# Patient Record
Sex: Male | Born: 1985 | Race: Black or African American | Hispanic: No | Marital: Single | Smoking: Never smoker
Health system: Southern US, Community
[De-identification: ages and names within clinical notes are randomized; demographics above are authoritative.]

## PROBLEM LIST (undated history)

## (undated) DIAGNOSIS — B019 Varicella without complication: Secondary | ICD-10-CM

## (undated) DIAGNOSIS — Z21 Asymptomatic human immunodeficiency virus [HIV] infection status: Secondary | ICD-10-CM

## (undated) DIAGNOSIS — B20 Human immunodeficiency virus [HIV] disease: Secondary | ICD-10-CM

## (undated) HISTORY — DX: Asymptomatic human immunodeficiency virus (hiv) infection status: Z21

## (undated) HISTORY — DX: Human immunodeficiency virus (HIV) disease: B20

## (undated) HISTORY — DX: Varicella without complication: B01.9

---
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
## 2004-05-19 ENCOUNTER — Emergency Department (HOSPITAL_COMMUNITY): Admission: EM | Admit: 2004-05-19 | Discharge: 2004-05-19 | Payer: Self-pay | Admitting: Emergency Medicine

## 2004-07-10 ENCOUNTER — Emergency Department (HOSPITAL_COMMUNITY): Admission: EM | Admit: 2004-07-10 | Discharge: 2004-07-10 | Payer: Self-pay | Admitting: Emergency Medicine

## 2005-06-22 ENCOUNTER — Emergency Department (HOSPITAL_COMMUNITY): Admission: EM | Admit: 2005-06-22 | Discharge: 2005-06-22 | Payer: Self-pay | Admitting: Emergency Medicine

## 2007-10-24 ENCOUNTER — Emergency Department (HOSPITAL_COMMUNITY): Admission: EM | Admit: 2007-10-24 | Discharge: 2007-10-24 | Payer: Self-pay | Admitting: Emergency Medicine

## 2009-05-19 ENCOUNTER — Ambulatory Visit: Payer: Self-pay | Admitting: Family Medicine

## 2009-05-19 DIAGNOSIS — B351 Tinea unguium: Secondary | ICD-10-CM

## 2009-05-20 LAB — CONVERTED CEMR LAB
Albumin: 4.3 g/dL (ref 3.5–5.2)
Alkaline Phosphatase: 64 units/L (ref 39–117)
Basophils Absolute: 0 10*3/uL (ref 0.0–0.1)
Bilirubin, Direct: 0.1 mg/dL (ref 0.0–0.3)
CO2: 31 meq/L (ref 19–32)
Calcium: 9.6 mg/dL (ref 8.4–10.5)
Chloride: 106 meq/L (ref 96–112)
Eosinophils Absolute: 0.1 10*3/uL (ref 0.0–0.7)
Glucose, Bld: 92 mg/dL (ref 70–99)
Hemoglobin: 14.7 g/dL (ref 13.0–17.0)
Lymphocytes Relative: 39.7 % (ref 12.0–46.0)
MCHC: 33.2 g/dL (ref 30.0–36.0)
Neutro Abs: 2.4 10*3/uL (ref 1.4–7.7)
Neutrophils Relative %: 48.4 % (ref 43.0–77.0)
Potassium: 3.8 meq/L (ref 3.5–5.1)
RBC: 4.47 M/uL (ref 4.22–5.81)
RDW: 11.8 % (ref 11.5–14.6)
Sodium: 143 meq/L (ref 135–145)
Total CHOL/HDL Ratio: 3

## 2009-06-01 ENCOUNTER — Ambulatory Visit: Payer: Self-pay | Admitting: Family Medicine

## 2009-06-01 LAB — CONVERTED CEMR LAB: Free T4: 0.8 ng/dL (ref 0.6–1.6)

## 2009-06-03 LAB — CONVERTED CEMR LAB: TSH: 0.53 microintl units/mL (ref 0.35–5.50)

## 2009-06-15 ENCOUNTER — Emergency Department (HOSPITAL_BASED_OUTPATIENT_CLINIC_OR_DEPARTMENT_OTHER): Admission: EM | Admit: 2009-06-15 | Discharge: 2009-06-15 | Payer: Self-pay | Admitting: Emergency Medicine

## 2009-06-17 ENCOUNTER — Ambulatory Visit: Payer: Self-pay | Admitting: Family Medicine

## 2009-06-18 ENCOUNTER — Emergency Department (HOSPITAL_COMMUNITY): Admission: EM | Admit: 2009-06-18 | Discharge: 2009-06-18 | Payer: Self-pay | Admitting: Family Medicine

## 2009-06-19 ENCOUNTER — Encounter: Payer: Self-pay | Admitting: Family Medicine

## 2009-06-19 ENCOUNTER — Telehealth: Payer: Self-pay | Admitting: Family Medicine

## 2009-07-15 ENCOUNTER — Ambulatory Visit: Payer: Self-pay | Admitting: Family Medicine

## 2009-07-16 ENCOUNTER — Encounter: Payer: Self-pay | Admitting: Family Medicine

## 2010-02-15 ENCOUNTER — Telehealth: Payer: Self-pay | Admitting: Family Medicine

## 2010-02-25 ENCOUNTER — Inpatient Hospital Stay (HOSPITAL_COMMUNITY): Admission: EM | Admit: 2010-02-25 | Discharge: 2009-06-21 | Payer: Self-pay | Admitting: Emergency Medicine

## 2010-04-20 NOTE — Progress Notes (Signed)
Summary: refill at new pharmacy  Phone Note Refill Request Call back at [phone] Message from:  Patient---live call  Refills Requested: Medication #1:  TERBINAFINE HCL 250 MG TABS one by mouth once daily for 3 months.. call  [facility] in [place] @ phone number-[phone].  any? call pt  Initial call taken by: Warnell Forester,  February 15, 2010 2:19 PM  Follow-up for Phone Call        pt must have follow up before any further refills. We would have to assess liver before prescribing requested med. Follow-up by: Evelena Peat MD,  February 15, 2010 2:46 PM  Additional Follow-up for Phone Call Additional follow up Details #1::        pt will call back to make an appt. Additional Follow-up by: Warnell Forester,  February 15, 2010 3:20 PM

## 2010-04-20 NOTE — Letter (Signed)
Summary: Generic Letter  [place] [facility]  [address] [place], [place] [address]   Phone: [phone]  Fax: [phone]    06/19/2009   Regarding:  Jose Olsen [address] [place], [place]  [address]  Attention:  [name] is a patient of mine at [facility] in [place], [place].  He was recently admitted to our local hospital [facility]) and will be unable to keep his court date for Monday June 22 2009 for that reason.  His date of discharge has not been determined at this time.     Sincerely,   Evelena Peat MD

## 2010-04-20 NOTE — Letter (Signed)
Summary: FMLA  FMLA   Imported By: [employer]ian [employer] 07/16/2009 11:05:14  _____________________________________________________________________  External Attachment:    Type:   Image     Comment:   External Document

## 2010-04-20 NOTE — Progress Notes (Signed)
Summary: Patient admitted to [facility]  Phone Note Call from Patient Call back at [phone]   Caller: Patient Reason for Call: Talk to Nurse Summary of Call: Patient was admitted to the hospital last night with suspected Colitis.  Patient wanted Dr. Caryl Never to know he was admitted and also wanted to know when a physician was going to come and see him. Initial call taken by: Everrett Coombe,  June 19, 2009 3:17 PM  Follow-up for Phone Call        Pt called to adv that he needs to have a letter faxed to the courthouse in [place] stating the patient is in the hospital and can't be in court on Monday, 06/22/2009... Pt was admitted for possible ulcerative colitis... Pt adv that he was seen Wednesday by Dr Caryl Never for sxs.... Pt adv that [facility] staff adv that the letter would need to be established by his PCP or the Doctor on call for the hospital, and pt adv that he hasn't seen a doctor since 11:30pm last night.... Letter can be faxed to [employer] at  [phone] - Attention: Marguerite Olea.  Pt can be reached at [phone] [facility] hospital) with any questions or concerns.  Follow-up by: Debbra Riding,  June 19, 2009 3:46 PM  Additional Follow-up for Phone Call Additional follow up Details #1::        spoke with patient.  He is waiting for further testing, presumably colonoscopy to further evaluate.  Will send letter to [place] on pt's behalf to notify of conflict with court date in [place] for Monday. Additional Follow-up by: Evelena Peat MD,  June 19, 2009 4:51 PM    Additional Follow-up for Phone Call Additional follow up Details #2::    Letter faxed as requested, confirmation received Follow-up by: Sid Falcon LPN,  June 19, 2009 5:06 PM

## 2010-04-20 NOTE — Assessment & Plan Note (Signed)
Summary: GI virus/dm   Vital Signs:  Patient profile:   25 year old male Weight:      178 pounds Temp:     98.7 degrees F oral BP sitting:   120 / 80  (left arm) Cuff size:   regular  Vitals Entered By: Sid Falcon LPN (June 17, 2009 1:39 PM) CC: ongoing diarrhea, GI virus   History of Present Illness: Patient seen from ER followup. Onset Sunday night of chills and subsequent nausea and diarrhea. No vomiting. Nausea has improved but continues to have about 8-10 watery mostly nonbloody stools per day. Chills have resolved. Poor appetite.  Labs reviewed from the ER. Urinalysis unremarkable. Electrolytes normal. White blood count 16.8 thousand. Has some crampy diffuse abdominal pain. Denies dysuria. Only tried one dose of Imodium in emergency room and this did not help much. None since then. No recent antibiotics or travels.  IVFs in ER.  Allergies (verified): No Known Drug Allergies  Past History:  Past Medical History: Last updated: 05/19/2009 Chicken pox  Review of Systems       The patient complains of anorexia.  The patient denies fever, melena, severe indigestion/heartburn, and incontinence.    Physical Exam  General:  Well-developed,well-nourished,in no acute distress; alert,appropriate and cooperative throughout examination Mouth:  Oral mucosa and oropharynx without lesions or exudates.  Teeth in good repair. Neck:  No deformities, masses, or tenderness noted. Lungs:  Normal respiratory effort, chest expands symmetrically. Lungs are clear to auscultation, no crackles or wheezes. Heart:  Normal rate and regular rhythm. S1 and S2 normal without gallop, murmur, click, rub or other extra sounds. Abdomen:  soft, non-tender, normal bowel sounds, no distention, no masses, no guarding, and no rigidity. minimal tenderness right and left lower quadrants to deep palpation. No guarding or rebound. soft, non-tender, normal bowel sounds, no distention, no masses, no guarding, no  rigidity, no hepatomegaly, and no splenomegaly.     Impression & Recommendations:  Problem # 1:  GASTROENTERITIS, VIRAL (ICD-008.8) no evidence for significant dehydration at this time.  Complete Medication List: 1)  Terbinafine Hcl 250 Mg Tabs (Terbinafine hcl) .... One by mouth once daily for 3 months.  Patient Instructions: 1)  Try over-the-counter Imodium 2)  Drink plenty of fluids 3)  Follow up immediately if you develop any increased fever or worsening symptoms 4)  Eat more potassium rich foods such as bananas, oranje juice, and salt substitutes .  5)  try electrolyte replacement such as Gatorade 6)  The main problem with gastroentereritis is dehydration. Drink plenty of fluids and take solids as you feel better. If you are unable to keep anything down and/or you show signs of dehydration( dry cracked lips, lack of tears, not urinating, very sleepy) , call our office.

## 2010-04-20 NOTE — Assessment & Plan Note (Signed)
Summary: new to est-requesting cpx-will fast//ccm   Vital Signs:  Patient profile:   25 year old male Height:      70.25 inches Weight:      181 pounds BMI:     25.88 Temp:     99.2 degrees F oral Pulse rate:   80 / minute Pulse rhythm:   regular Resp:     12 per minute BP sitting:   112 / 80  (left arm) Cuff size:   regular  Vitals Entered By: Sid Falcon LPN (May 19, 9145 8:48 AM)  Nutrition Counseling: [name] BMI is greater than 25 and therefore counseled on weight management options. CC: New to establish   History of Present Illness: New patient to establish care. Patient here requesting complete physical.  No chronic medical problems. No prior surgeries. No medications. No known drug allergies.  Family history significant for mother and grandmother with type 2 diabetes. Uncle with prostate cancer. Aunts and uncles with stroke and hypertension history.  Patient was incarcerated past year and a half and recently got out. Currently unemployed. Tetanus 2005. Never smoked. Rare alcohol use. No history of IV drug use.  Patient has some chronic nail changes toenails previously diagnosed as onychomycosis. He would like to explore treatment options. No history of any liver problems.  Preventive Screening-Counseling & Management  Alcohol-Tobacco     Smoking Status: never  Caffeine-Diet-Exercise     Does Patient Exercise: no  Allergies (verified): No Known Drug Allergies  Past History:  Family History: Last updated: 05/19/2009 Grandmother, diabetes Alcoholism, uncle Prostate cancer, great grandfather HBP, stroke, uncle  Social History: Last updated: 05/19/2009 Occupation:  Unemployed Never Smoked Alcohol use-yes Regular exercise-no  Risk Factors: Exercise: no (05/19/2009)  Risk Factors: Smoking Status: never (05/19/2009)  Past Medical History: Chicken pox PMH-FH-SH reviewed for relevance  Family History: Grandmother, diabetes Alcoholism,  uncle Prostate cancer, great grandfather HBP, stroke, uncle  Social History: Occupation:  Unemployed Never Smoked Alcohol use-yes Regular exercise-no Smoking Status:  never Occupation:  employed Does Patient Exercise:  no  Review of Systems  The patient denies anorexia, fever, weight loss, weight gain, vision loss, decreased hearing, hoarseness, chest pain, syncope, dyspnea on exertion, peripheral edema, prolonged cough, headaches, hemoptysis, abdominal pain, melena, hematochezia, severe indigestion/heartburn, hematuria, incontinence, genital sores, muscle weakness, suspicious skin lesions, transient blindness, difficulty walking, depression, unusual weight change, abnormal bleeding, enlarged lymph nodes, and testicular masses.    Physical Exam  General:  Well-developed,well-nourished,in no acute distress; alert,appropriate and cooperative throughout examination Head:  Normocephalic and atraumatic without obvious abnormalities. No apparent alopecia or balding. Eyes:  No corneal or conjunctival inflammation noted. EOMI. Perrla. Funduscopic exam benign, without hemorrhages, exudates or papilledema. Vision grossly normal. Ears:  External ear exam shows no significant lesions or deformities.  Otoscopic examination reveals clear canals, tympanic membranes are intact bilaterally without bulging, retraction, inflammation or discharge. Hearing is grossly normal bilaterally. Mouth:  Oral mucosa and oropharynx without lesions or exudates.  Teeth in good repair. Neck:  No deformities, masses, or tenderness noted. Lungs:  Normal respiratory effort, chest expands symmetrically. Lungs are clear to auscultation, no crackles or wheezes. Heart:  Normal rate and regular rhythm. S1 and S2 normal without gallop, murmur, click, rub or other extra sounds. Abdomen:  Bowel sounds positive,abdomen soft and non-tender without masses, organomegaly or hernias noted. Genitalia:  Testes bilaterally descended without  nodularity, tenderness or masses. No scrotal masses or lesions. No penis lesions or urethral discharge. Msk:  No deformity or  scoliosis noted of thoracic or lumbar spine.   Extremities:  patient has diffuse nail changes involving all toenails. He has brittle nail changes consistent with onychomycosis. Neurologic:  No cranial nerve deficits noted. Station and gait are normal. Plantar reflexes are down-going bilaterally. DTRs are symmetrical throughout. Sensory, motor and coordinative functions appear intact. Skin:  Intact without suspicious lesions or rashes Cervical Nodes:  No lymphadenopathy noted Psych:  normally interactive, good eye contact, not anxious appearing, and not depressed appearing.     Impression & Recommendations:  Problem # 1:  Preventive Health Care (ICD-V70.0) obtain screening lab work. Discussed importance of regular exercise  Problem # 2:  ONYCHOMYCOSIS (ICD-110.1)  consider treatment with Lamisil if hepatic function normal  His updated medication list for this problem includes:    Terbinafine Hcl 250 Mg Tabs (Terbinafine hcl) ..... One by mouth once daily for 3 months.  Complete Medication List: 1)  Terbinafine Hcl 250 Mg Tabs (Terbinafine hcl) .... One by mouth once daily for 3 months.  Other Orders: Venipuncture (16109) TLB-Lipid Panel (80061-LIPID) TLB-BMP (Basic Metabolic Panel-BMET) (80048-METABOL) TLB-CBC Platelet - w/Differential (85025-CBCD) TLB-Hepatic/Liver Function Pnl (80076-HEPATIC) TLB-TSH (Thyroid Stimulating Hormone) (60454-UJW)  Patient Instructions: 1)  We will call you tomorrow regarding lab work. 2)  We will consider treatment course with Lamisil for toenail infection if liver function normal Prescriptions: TERBINAFINE HCL 250 MG TABS (TERBINAFINE HCL) one by mouth once daily for 3 months.  #30 x 2   Entered and Authorized by:   Evelena Peat MD   Signed by:   Evelena Peat MD on 05/19/2009   Method used:   Electronically to         [facility] [phone][facility] (retail)       [address]       [place], [place]  [address]       Ph: [phone]       Fax: [phone]   RxID:   [id number]   Preventive Care Screening  Last Tetanus Booster:    Date:  03/22/2003    Results:  Historical    Appended Document: new to est-requesting cpx-will fast//ccm liver function normal.  I have sent rx to his pharmacy for antifungal tablet.  Please notify pt that he can start medication.  Appended Document: new to est-requesting cpx-will fast//ccm Pt informed Rx med at his pharmacy

## 2010-06-09 LAB — CBC
HCT: 36.2 % — ABNORMAL LOW (ref 39.0–52.0)
HCT: 36.5 % — ABNORMAL LOW (ref 39.0–52.0)
HCT: 37.8 % — ABNORMAL LOW (ref 39.0–52.0)
Hemoglobin: 12 g/dL — ABNORMAL LOW (ref 13.0–17.0)
Hemoglobin: 12.8 g/dL — ABNORMAL LOW (ref 13.0–17.0)
MCHC: 32.8 g/dL (ref 30.0–36.0)
MCHC: 34.1 g/dL (ref 30.0–36.0)
MCV: 97.2 fL (ref 78.0–100.0)
MCV: 97.2 fL (ref 78.0–100.0)
Platelets: 206 10*3/uL (ref 150–400)
RBC: 3.76 MIL/uL — ABNORMAL LOW (ref 4.22–5.81)
RBC: 3.88 MIL/uL — ABNORMAL LOW (ref 4.22–5.81)
RDW: 12.4 % (ref 11.5–15.5)
RDW: 12.7 % (ref 11.5–15.5)
WBC: 8 10*3/uL (ref 4.0–10.5)

## 2010-06-09 LAB — CULTURE, BLOOD (ROUTINE X 2): Culture: NO GROWTH

## 2010-06-09 LAB — GLUCOSE, CAPILLARY
Glucose-Capillary: 102 mg/dL — ABNORMAL HIGH (ref 70–99)
Glucose-Capillary: 105 mg/dL — ABNORMAL HIGH (ref 70–99)
Glucose-Capillary: 111 mg/dL — ABNORMAL HIGH (ref 70–99)
Glucose-Capillary: 219 mg/dL — ABNORMAL HIGH (ref 70–99)
Glucose-Capillary: 97 mg/dL (ref 70–99)

## 2010-06-09 LAB — URINE CULTURE: Colony Count: 3000

## 2010-06-09 LAB — COMPREHENSIVE METABOLIC PANEL
ALT: 11 U/L (ref 0–53)
AST: 13 U/L (ref 0–37)
Albumin: 2.9 g/dL — ABNORMAL LOW (ref 3.5–5.2)
Alkaline Phosphatase: 43 U/L (ref 39–117)
CO2: 27 mEq/L (ref 19–32)
Chloride: 105 mEq/L (ref 96–112)
GFR calc non Af Amer: >60 mL/min (ref >60)
Potassium: 3.7 mEq/L (ref 3.5–5.1)
Sodium: 136 mEq/L (ref 135–145)
Total Bilirubin: 0.7 mg/dL (ref 0.3–1.2)

## 2010-06-09 LAB — BASIC METABOLIC PANEL
CO2: 30 mEq/L (ref 19–32)
Calcium: 8.7 mg/dL (ref 8.4–10.5)
Chloride: 107 mEq/L (ref 96–112)
Creatinine, Ser: 0.98 mg/dL (ref 0.4–1.5)
GFR calc Af Amer: >60 mL/min (ref >60)
GFR calc Af Amer: >60 mL/min (ref >60)
GFR calc non Af Amer: >60 mL/min (ref >60)
Potassium: 3.6 mEq/L (ref 3.5–5.1)
Potassium: 3.6 mEq/L (ref 3.5–5.1)
Sodium: 139 mEq/L (ref 135–145)
Sodium: 141 mEq/L (ref 135–145)

## 2010-06-09 LAB — CLOSTRIDIUM DIFFICILE EIA

## 2010-06-09 LAB — GIARDIA/CRYPTOSPORIDIUM SCREEN(EIA): Cryptosporidium Screen (EIA): NEGATIVE

## 2010-06-14 LAB — STOOL CULTURE

## 2010-06-14 LAB — CBC
HCT: 42.3 % (ref 39.0–52.0)
Hemoglobin: 13.1 g/dL (ref 13.0–17.0)
MCHC: 34.4 g/dL (ref 30.0–36.0)
MCV: 96.8 fL (ref 78.0–100.0)
Platelets: 227 10*3/uL (ref 150–400)
RBC: 3.94 MIL/uL — ABNORMAL LOW (ref 4.22–5.81)
RDW: 11.9 % (ref 11.5–15.5)

## 2010-06-14 LAB — BASIC METABOLIC PANEL
BUN: 14 mg/dL (ref 6–23)
Calcium: 8.9 mg/dL (ref 8.4–10.5)
Creatinine, Ser: 1.2 mg/dL (ref 0.4–1.5)
GFR calc non Af Amer: >60 mL/min (ref >60)
Glucose, Bld: 120 mg/dL — ABNORMAL HIGH (ref 70–99)
Potassium: 3.7 mEq/L (ref 3.5–5.1)

## 2010-06-14 LAB — DIFFERENTIAL
Basophils Absolute: 0 10*3/uL (ref 0.0–0.1)
Eosinophils Absolute: 0 10*3/uL (ref 0.0–0.7)
Lymphocytes Relative: 8 % — ABNORMAL LOW (ref 12–46)
Lymphs Abs: 0.7 10*3/uL (ref 0.7–4.0)
Lymphs Abs: 0.8 10*3/uL (ref 0.7–4.0)
Monocytes Relative: 9 % (ref 3–12)
Neutrophils Relative %: 74 % (ref 43–77)
Neutrophils Relative %: 86 % — ABNORMAL HIGH (ref 43–77)

## 2010-06-14 LAB — URINALYSIS, ROUTINE W REFLEX MICROSCOPIC
Bilirubin Urine: NEGATIVE
Ketones, ur: 15 mg/dL — AB
Ketones, ur: NEGATIVE mg/dL
Nitrite: NEGATIVE
Nitrite: POSITIVE — AB
Protein, ur: NEGATIVE mg/dL
Specific Gravity, Urine: 1.025 (ref 1.005–1.030)
Urobilinogen, UA: 0.2 mg/dL (ref 0.0–1.0)
Urobilinogen, UA: 0.2 mg/dL (ref 0.0–1.0)

## 2010-06-14 LAB — TYPE AND SCREEN: ABO/RH(D): A POS

## 2010-06-14 LAB — LIPASE, BLOOD: Lipase: 23 U/L (ref 11–59)

## 2010-06-14 LAB — COMPREHENSIVE METABOLIC PANEL
CO2: 26 mEq/L (ref 19–32)
Calcium: 8.2 mg/dL — ABNORMAL LOW (ref 8.4–10.5)
Creatinine, Ser: 1.04 mg/dL (ref 0.4–1.5)
GFR calc non Af Amer: >60 mL/min (ref >60)
Glucose, Bld: 92 mg/dL (ref 70–99)

## 2010-06-14 LAB — GIARDIA/CRYPTOSPORIDIUM SCREEN(EIA): Giardia Screen - EIA: NEGATIVE

## 2010-06-14 LAB — CLOSTRIDIUM DIFFICILE EIA

## 2010-06-14 LAB — PROTIME-INR: Prothrombin Time: 13.9 seconds (ref 11.6–15.2)

## 2010-06-14 LAB — APTT: aPTT: 29 seconds (ref 24–37)

## 2010-06-14 LAB — URINE MICROSCOPIC-ADD ON

## 2010-10-29 ENCOUNTER — Encounter: Payer: Self-pay | Admitting: Family Medicine

## 2010-11-01 ENCOUNTER — Encounter: Payer: Self-pay | Admitting: Family Medicine

## 2010-11-01 ENCOUNTER — Ambulatory Visit (INDEPENDENT_AMBULATORY_CARE_PROVIDER_SITE_OTHER): Payer: Self-pay | Admitting: Family Medicine

## 2010-11-01 VITALS — BP 120/78 | HR 80 | Temp 98.4°F | Resp 12 | Wt 186.0 lb

## 2010-11-01 DIAGNOSIS — Z113 Encounter for screening for infections with a predominantly sexual mode of transmission: Secondary | ICD-10-CM

## 2010-11-01 NOTE — Progress Notes (Signed)
  Subjective:    Patient ID: Jose Olsen, male    DOB: 1986-02-08, 25 y.o.   MRN: [id number]  HPI Patient seen with several questions regarding STD screening. Recently returned from vacation in 8 days ago went to urgent care with yellowish to greenish penile discharge. Onset about 10-11 days ago. No associated fever. Patient recalls chlamydia and GC testing and was given some type of shot, presumably ceftriaxone and oral antibiotic and his symptoms promptly cleared. He had HIV screening reportedly positive. He was referred to infectious disease clinic and is awaiting appointment at this time. He denies any residual symptoms. No dysuria. Denies any fever, chills, night sweats, rashes, appetite changes, or any recent weight changes.  Does have history of unprotected intercourse with male partner. He does recall previous hepatitis B vaccination. He requests confirmatory repeat testing today   Review of Systems  Constitutional: Negative for fever, chills, appetite change, fatigue and unexpected weight change.  HENT: Negative for sore throat.   Respiratory: Negative for cough and shortness of breath.   Cardiovascular: Negative for chest pain.  Gastrointestinal: Negative for vomiting, abdominal pain and diarrhea.  Genitourinary: Negative for dysuria.  Hematological: Negative for adenopathy.       Objective:   Physical Exam  Constitutional: He appears well-developed and well-nourished. No distress.  Cardiovascular: Normal rate, regular rhythm and normal heart sounds.   No murmur heard. Pulmonary/Chest: Effort normal and breath sounds normal. No respiratory distress. He has no wheezes. He has no rales.  Genitourinary: Penis normal.       No inguinal adenopathy. No penile rash  Musculoskeletal: He exhibits no edema.          Assessment & Plan:  Recent reported urethritis. Symptomatically improved with intramuscular and oral antibiotic. We do not have any old records from outside at this  time. Reportedly had positive HIV screen. Repeat lab testing today with urine probe for GC and Chlamydia, RPR, and HIV screen.

## 2010-11-01 NOTE — Patient Instructions (Signed)
Safer Sex Your caregiver wants you to have this information about the infections that can be transmitted from sexual contact and how to prevent them. The idea behind safer sex is that you can be sexually active, and at the same time reduce the risk of giving or getting a sexually transmitted disease (STD). Every person should be aware of how to prevent him/herself and his/her sex partner from getting a sexually transmitted disease. CAUSES OF STDS STDs are transmitted by sharing body fluids, which contain viruses and bacteria. The following fluids all transmit infections during sexual intercourse and sex acts:  Semen.   Saliva.   Urine.   Blood.   Vaginal mucus.  Examples of STDs include:  Chlamydia.   Gonorrhea.   Genital herpes.   Hepatitis B.   Human immunodeficiency virus or acquired immunodeficiency syndrome (HIV or AIDS).   Syphilis.   Trichomonas.   Pubic lice.   Human papillomavirus (HPV), which may include:   Genital warts.   Cervical dysplasia.   Cervical cancer (can develop with certain types of HPV).  SYMPTOMS Sexual diseases often cause few or no symptoms until they are advanced, so a person can be infected and spread the infection without knowing it. Some STDs respond to treatment very well. Others, like HIV and herpes, cannot be cured, but are treated to reduce their effects. Specific symptoms include:  Abnormal vaginal discharge.   Irritation or itching in and around the vagina, and in the pubic hair.   Pain during sexual intercourse.   Bleeding during sexual intercourse.   Pelvic or abdominal pain.   Fever.   Growths in and around the vagina.   An ulcer in or around the vagina.   Swollen glands in the groin area.  DIAGNOSIS  Blood tests.   Pap test.   Culture test of abnormal vaginal discharge.   A test that applies a solution and examines the cervix with a lighted magnifying scope (colposcopy).   A test that examines the pelvis  with a lighted tube, through a small incision (laparoscopy).  TREATMENT The treatment will depend on the cause of the STD.  Antibiotic treatment by injection, oral, creams, or suppositories in the vagina.   Over-the-counter medicated shampoo, to get rid of pubic lice.   Removing or treating growths with medicine, freezing, burning (electrocautery), or surgery.   Surgery treatment for HPV of the cervix.   Supportive medicines for herpes, HIV, AIDS, and hepatitis.  Being careful cannot eliminate all risk of infection, but sex can be made much safer. Safe sexual practices include body massage and gentle touching. Masturbation is safe, as long as body fluids do not contact skin that has sores or cuts. Dry kissing and oral sex on a man wearing a latex condom or on a woman wearing a male condom is also safe. Slightly less safe is intercourse while the man wears a latex condom or wet kissing. It is also safer to have one sex partner that you know is not having sex with anyone else. LENGTH OF ILLNESS An STD might be treated and cured in a week, sometimes a month, or more. And it can linger with symptoms for many years. STDs can also cause damage to the male organs. This can cause chronic pain, infertility, and recurrence of the STD, especially herpes, hepatitis, HIV, and HPV. HOME CARE INSTRUCTIONS AND PREVENTION  Alcohol and recreational drugs are often the reason given for not practicing safer sex. These substances affect your judgment. Alcohol and recreational   drugs can also impair your immune system, making you more vulnerable to disease.   Do not engage in risky and dangerous sexual practices, including:   Vaginal or anal sex without a condom.  Oral sex on a man without a condom.   Oral sex on a woman without a male condom.   Using saliva to lubricate a condom.  Any other sexual contact in which body fluids or blood from one partner contact the other partner.    You should use only  latex condoms for men and water soluble lubricants. Petroleum based lubricants or oils used to lubricate a condom will weaken the condom and increase the chance that it will break.   Think very carefully before having sex with anyone who is high risk for STDs and HIV. This includes IV drug users, people with multiple sexual partners, or people who have had an STD, or a positive hepatitis or HIV blood test.   Remember that even if your partner has had only one previous partner, their previous partner might have had multiple partners. If so, you are at high risk of being exposed to an STD. You and your sex partner should be the only sex partners with each other, with no one else involved.   A vaccine is available for hepatitis B and HPV through your caregiver or the Public Health Department. Everyone should be vaccinated with these vaccines.   Avoid risky sex practices. Sex acts that can break the skin make you more likely to get an STD.  SEEK MEDICAL CARE IF:  If you think you have an STD, even if you do not have any symptoms. Contact your caregiver for evaluation and treatment, if needed.   You think or know your sex partner has acquired an STD.   You have any of the symptoms mentioned above.  Document Released: 04/14/2004 Document Re-Released: 08/25/2009 Exit[employer] Patient Information 2011 [employer]. 

## 2010-11-02 LAB — RPR

## 2010-11-02 LAB — GC PROBE AMPLIFICATION, URINE: GC Probe Amp, Urine: NEGATIVE

## 2010-11-02 LAB — CHLAMYDIA PROBE AMPLIFICATION, URINE: Chlamydia, Swab/Urine, PCR: NEGATIVE

## 2010-11-05 LAB — HIV 1/2 CONFIRMATION
HIV-1 antibody: POSITIVE — AB
HIV-2 Ab: NEGATIVE

## 2010-11-08 ENCOUNTER — Telehealth: Payer: Self-pay | Admitting: Family Medicine

## 2010-11-08 NOTE — Progress Notes (Signed)
Quick Note:  Pt informed and yes he does have an appt with ID on 8/23. Pt will plan to call when back in town to pick up a copy of labs. ______

## 2010-11-08 NOTE — Progress Notes (Signed)
Quick Note:  Pt informed, he will call for copy when back in town, he does have appt with ID on 8/23 ______

## 2010-11-08 NOTE — Telephone Encounter (Signed)
Pt requesting results of labs from last Monday. Please contact pt.

## 2010-11-08 NOTE — Telephone Encounter (Signed)
Pt called about labs he had on 8/13. Anxious because he has not received a call with results. Please call at number provided.

## 2010-11-08 NOTE — Telephone Encounter (Signed)
Pt was informed of labs, he will pick up a copy when back in town

## 2010-11-11 ENCOUNTER — Ambulatory Visit (INDEPENDENT_AMBULATORY_CARE_PROVIDER_SITE_OTHER): Payer: Self-pay

## 2010-11-11 DIAGNOSIS — B20 Human immunodeficiency virus [HIV] disease: Secondary | ICD-10-CM

## 2010-11-11 DIAGNOSIS — Z23 Encounter for immunization: Secondary | ICD-10-CM

## 2010-11-11 DIAGNOSIS — Z111 Encounter for screening for respiratory tuberculosis: Secondary | ICD-10-CM

## 2010-11-11 LAB — URINALYSIS, ROUTINE W REFLEX MICROSCOPIC
Hgb urine dipstick: NEGATIVE
Ketones, ur: NEGATIVE mg/dL
Leukocytes, UA: NEGATIVE
Nitrite: NEGATIVE
Specific Gravity, Urine: 1.022 (ref 1.005–1.030)
Urobilinogen, UA: 0.2 mg/dL (ref 0.0–1.0)

## 2010-11-12 LAB — T-HELPER CELL (CD4) - (RCID CLINIC ONLY)
CD4 % Helper T Cell: 36 % (ref 33–55)
CD4 T Cell Abs: 650 uL (ref 400–2700)

## 2010-11-12 LAB — COMPLETE METABOLIC PANEL WITH GFR
Albumin: 4.5 g/dL (ref 3.5–5.2)
BUN: 9 mg/dL (ref 6–23)
CO2: 29 mEq/L (ref 19–32)
Calcium: 9.5 mg/dL (ref 8.4–10.5)
Chloride: 105 mEq/L (ref 96–112)
Creat: 1.02 mg/dL (ref 0.50–1.35)
GFR, Est African American: >60 mL/min (ref >60)
GFR, Est Non African American: >60 mL/min (ref >60)
Glucose, Bld: 85 mg/dL (ref 70–99)
Potassium: 4.1 mEq/L (ref 3.5–5.3)

## 2010-11-12 LAB — CBC WITH DIFFERENTIAL/PLATELET
Basophils Absolute: 0 10*3/uL (ref 0.0–0.1)
Eosinophils Relative: 2 % (ref 0–5)
Lymphocytes Relative: 53 % — ABNORMAL HIGH (ref 12–46)
Lymphs Abs: 1.8 10*3/uL (ref 0.7–4.0)
MCV: 96.4 fL (ref 78.0–100.0)
Neutro Abs: 1.2 10*3/uL — ABNORMAL LOW (ref 1.7–7.7)
Neutrophils Relative %: 35 % — ABNORMAL LOW (ref 43–77)
Platelets: 241 10*3/uL (ref 150–400)
RBC: 4.45 MIL/uL (ref 4.22–5.81)
RDW: 12.8 % (ref 11.5–15.5)
WBC: 3.5 10*3/uL — ABNORMAL LOW (ref 4.0–10.5)

## 2010-11-12 LAB — LIPID PANEL
LDL Cholesterol: 115 mg/dL — ABNORMAL HIGH (ref 0–99)
Triglycerides: 84 mg/dL (ref <150)
VLDL: 17 mg/dL (ref 0–40)

## 2010-11-12 LAB — GC/CHLAMYDIA PROBE AMP, URINE: GC Probe Amp, Urine: UNDETERMINED

## 2010-11-12 LAB — HEPATITIS B CORE ANTIBODY, IGM: Hep B C IgM: NEGATIVE

## 2010-11-12 LAB — RPR

## 2010-11-13 LAB — TB SKIN TEST: TB Skin Test: NEGATIVE mm

## 2010-12-01 LAB — HIV-1 GENOTYPR PLUS

## 2010-12-02 ENCOUNTER — Encounter: Payer: Self-pay | Admitting: Internal Medicine

## 2010-12-02 ENCOUNTER — Ambulatory Visit (INDEPENDENT_AMBULATORY_CARE_PROVIDER_SITE_OTHER): Payer: BC Managed Care – PPO | Admitting: Internal Medicine

## 2010-12-02 VITALS — BP 134/73 | HR 73 | Temp 97.8°F | Ht 71.5 in | Wt 188.0 lb

## 2010-12-02 DIAGNOSIS — B20 Human immunodeficiency virus [HIV] disease: Secondary | ICD-10-CM

## 2010-12-02 DIAGNOSIS — Z23 Encounter for immunization: Secondary | ICD-10-CM

## 2010-12-02 MED ORDER — EFAVIRENZ-EMTRICITAB-TENOFOVIR 600-200-300 MG PO TABS: 1.0000 | ORAL_TABLET | Freq: Every day | ORAL | Status: DC

## 2010-12-02 NOTE — Progress Notes (Signed)
  Subjective:    Patient ID: Jose Olsen, male    DOB: 1985-06-06, 25 y.o.   MRN: [id number]  HPI this is a new patient here for his first visit for 042. This 25 year old male was recently diagnosed with HIV following an episode of gonorrhea infection. He was seen by an urgent care center for penile discharge and given empiric therapy with ceftriaxone and azithromycin which improved his penile discharge. He also was tested for HIV and noted to be positive at that time. He has been sent here as a new patient for his diagnosis. Prior to this positive test, he was last tested several years ago. His risk factors are MSM and he suspects he was infected about one year ago. Today he is eager to start therapy. He tells me that he is able to take medicines daily.he denies any dysphasia, rashes, shortness of breath or weight loss.    Review of Systems  All other systems reviewed and are negative.       Objective:   Physical Exam  Constitutional: He is oriented to person, place, and time. He appears well-developed and well-nourished.  HENT:  Mouth/Throat: Oropharynx is clear and moist. No oropharyngeal exudate.  Eyes: No scleral icterus.  Neck: Normal range of motion. Neck supple.  Cardiovascular: Normal rate, regular rhythm and normal heart sounds.   No murmur heard. Pulmonary/Chest: Effort normal and breath sounds normal. No respiratory distress. He has no wheezes.  Abdominal: Soft. Bowel sounds are normal. He exhibits no distension. There is no tenderness.  Genitourinary: Penis normal. No penile tenderness.       No discharge, no lesions  Lymphadenopathy:    He has no cervical adenopathy.  Neurological: He is alert and oriented to person, place, and time.  Skin: Skin is warm and dry. No erythema.  Psychiatric: He has a normal mood and affect. His behavior is normal.          Assessment & Plan:

## 2010-12-02 NOTE — Assessment & Plan Note (Signed)
He is very eager to start medications today and I discussed all the different options. He has no history of significant mental illness no family history of kidney disease. Therefore we'll start him on Atripla. I discussed the side effects of Atripla that include CNS disturbances as well as long-term effects on kidneys and bone density. I discussed taking it at night on an empty stomach and the patient states that he is very much capable of taking her medicines. I discussed with him also the low barrier to resistance and the need to take them at the same time every day. At this time otherwise he has not in need of any other prophylaxis. He was recently treated with ceftriaxone and p.o. Therapy for her presumed GC and Chlamydia in fact his GC test is indeterminate today which suggests he was recently treated. Today he is having no other problems. He will return in approximately 6 weeks to check his labs to assure compliance and improvement in his immune reconstitution. I also discussed prevention with condoms to protect himself as well as others. All questions were answered.

## 2011-01-20 ENCOUNTER — Other Ambulatory Visit (INDEPENDENT_AMBULATORY_CARE_PROVIDER_SITE_OTHER): Payer: BC Managed Care – PPO

## 2011-01-20 ENCOUNTER — Other Ambulatory Visit: Payer: Self-pay | Admitting: Infectious Diseases

## 2011-01-20 DIAGNOSIS — B20 Human immunodeficiency virus [HIV] disease: Secondary | ICD-10-CM

## 2011-01-20 LAB — COMPLETE METABOLIC PANEL WITH GFR
AST: 17 U/L (ref 0–37)
BUN: 11 mg/dL (ref 6–23)
Calcium: 9.5 mg/dL (ref 8.4–10.5)
Chloride: 104 mEq/L (ref 96–112)
Creat: 1.01 mg/dL (ref 0.50–1.35)
GFR, Est African American: >90 mL/min (ref >90)
Total Bilirubin: 0.6 mg/dL (ref 0.3–1.2)

## 2011-01-20 LAB — CBC WITH DIFFERENTIAL/PLATELET
Lymphocytes Relative: 35 % (ref 12–46)
Lymphs Abs: 1.6 10*3/uL (ref 0.7–4.0)
Neutrophils Relative %: 50 % (ref 43–77)
Platelets: 268 10*3/uL (ref 150–400)
RBC: 4.72 MIL/uL (ref 4.22–5.81)
WBC: 4.6 10*3/uL (ref 4.0–10.5)

## 2011-01-21 LAB — T-HELPER CELL (CD4) - (RCID CLINIC ONLY)
CD4 % Helper T Cell: 36 % (ref 33–55)
CD4 T Cell Abs: 580 uL (ref 400–2700)

## 2011-01-24 LAB — HIV-1 RNA QUANT-NO REFLEX-BLD
HIV 1 RNA Quant: 114000 copies/mL — ABNORMAL HIGH (ref <20)
HIV-1 RNA Quant, Log: 5.06 {Log} — ABNORMAL HIGH (ref <1.30)

## 2011-02-03 ENCOUNTER — Ambulatory Visit (INDEPENDENT_AMBULATORY_CARE_PROVIDER_SITE_OTHER): Payer: BC Managed Care – PPO | Admitting: Internal Medicine

## 2011-02-03 ENCOUNTER — Ambulatory Visit: Payer: BC Managed Care – PPO | Admitting: Internal Medicine

## 2011-02-03 ENCOUNTER — Other Ambulatory Visit: Payer: Self-pay | Admitting: *Deleted

## 2011-02-03 ENCOUNTER — Encounter: Payer: Self-pay | Admitting: Internal Medicine

## 2011-02-03 VITALS — BP 146/82 | HR 79 | Temp 98.1°F | Ht 70.0 in | Wt 179.0 lb

## 2011-02-03 DIAGNOSIS — B20 Human immunodeficiency virus [HIV] disease: Secondary | ICD-10-CM

## 2011-02-03 DIAGNOSIS — Z23 Encounter for immunization: Secondary | ICD-10-CM

## 2011-02-03 MED ORDER — EFAVIRENZ-EMTRICITAB-TENOFOVIR 600-200-300 MG PO TABS: 1.0000 | ORAL_TABLET | Freq: Every day | ORAL | Status: DC

## 2011-02-03 NOTE — Progress Notes (Signed)
Addended by: Wendall Mola A on: 02/03/2011 03:24 PM   Modules accepted: Orders

## 2011-02-03 NOTE — Progress Notes (Signed)
  Subjective:    Patient ID: Jose Olsen, male    DOB: 1986-02-13, 25 y.o.   MRN: [id number]  HPI This patient comes in for followup. He was seen last visit as a new patient with a new diagnosis of HIV and was started on regimen of Atripla. I push on the patient to the one day and did not like the side effects and so he stopped. He states that he did take it in the afternoon but felt a little dizzy and so did not take it again. This was despite being told to take it at night. He otherwise has no specific complaints.   Review of Systems  Constitutional: Negative for fever, chills and fatigue.  HENT: Negative for trouble swallowing.   Gastrointestinal: Negative for diarrhea.  Genitourinary: Negative for discharge.  Musculoskeletal: Negative for myalgias and arthralgias.  Skin: Negative for rash.  Hematological: Negative for adenopathy.  Psychiatric/Behavioral: Negative for sleep disturbance and dysphoric mood.  All other systems reviewed and are negative.       Objective:   Physical Exam  Constitutional: He appears well-developed and well-nourished.  HENT:  Mouth/Throat: No oropharyngeal exudate.  Cardiovascular: Normal rate, regular rhythm and normal heart sounds.   No murmur heard. Pulmonary/Chest: Effort normal and breath sounds normal. He has no wheezes.  Abdominal: Soft. Bowel sounds are normal. There is no tenderness.  Skin: Skin is warm and dry. No rash noted.          Assessment & Plan:

## 2011-02-03 NOTE — Assessment & Plan Note (Signed)
I discussed different treatment options including retrial of the Atripla, Complera and boosted Prezista patient with like to try again Atripla at this time and will followup in one month to discuss if he needs to change therapy or not. He was told that he should continue until he is sees me at the next appointment and not stop in the meantime. He voices understanding also that he will take it at night on an empty stomach. The Complera would be difficult for him with the need to eat a fatty meal with it and therefore we'll try this at this time and we'll further discuss at the next visit. He was reminded about condom use

## 2011-02-03 NOTE — Patient Instructions (Signed)
Start medicine today and follow up in 1 month

## 2011-02-21 ENCOUNTER — Other Ambulatory Visit: Payer: Self-pay | Admitting: *Deleted

## 2011-02-21 DIAGNOSIS — B20 Human immunodeficiency virus [HIV] disease: Secondary | ICD-10-CM

## 2011-02-21 MED ORDER — EFAVIRENZ-EMTRICITAB-TENOFOVIR 600-200-300 MG PO TABS
1.0000 | ORAL_TABLET | Freq: Every day | ORAL | Status: DC
Start: 2011-02-21 — End: 2011-04-26

## 2011-03-03 ENCOUNTER — Ambulatory Visit (INDEPENDENT_AMBULATORY_CARE_PROVIDER_SITE_OTHER): Payer: BC Managed Care – PPO | Admitting: Internal Medicine

## 2011-03-03 ENCOUNTER — Encounter: Payer: Self-pay | Admitting: Internal Medicine

## 2011-03-03 VITALS — BP 132/85 | HR 86 | Temp 98.5°F | Resp 14 | Ht 71.0 in | Wt 177.0 lb

## 2011-03-03 DIAGNOSIS — B20 Human immunodeficiency virus [HIV] disease: Secondary | ICD-10-CM

## 2011-03-03 NOTE — Assessment & Plan Note (Signed)
He has restarted his Atripla and is tolerating it well. He does discuss vivid dreams however this is not interfering with his sleep in any way. Today I will have his labs checked to assure that he is having the desired effect with a decrease in his viral load. He does endorse good adherence and compliance with medication. He was reminded of the need to use condoms with all sexual activity.

## 2011-03-03 NOTE — Patient Instructions (Signed)
Follow up in 2 months

## 2011-03-03 NOTE — Progress Notes (Signed)
  Subjective:    Patient ID: Jose Olsen, male    DOB: December 08, 1985, 25 y.o.   MRN: [id number]  HPI this patient comes in for followup of his HIV. He was started on Atripla initially about 2 months ago, however he took one dose and did not like the side effects, and he therefore stopped it. After his last visit however I did explain to him the side effects, once again, and that they wear off over time. He therefore opted to restart the Atripla and he returns in followup having been on Atripla for one month. He tells me he's had no significant side effects other than vivid dreams. He's had no sleep disturbances or other issues.    Review of Systems  Constitutional: Positive for fatigue. Negative for fever, chills and unexpected weight change.  HENT: Negative for sore throat and trouble swallowing.   Respiratory: Negative for cough, chest tightness and shortness of breath.   Cardiovascular: Negative for chest pain.  Gastrointestinal: Negative for nausea, abdominal pain and diarrhea.  Musculoskeletal: Negative for myalgias and arthralgias.  Skin: Negative for rash.  Neurological: Negative for dizziness, light-headedness and headaches.  Psychiatric/Behavioral: Negative for sleep disturbance and dysphoric mood. The patient is not nervous/anxious.        Objective:   Physical Exam  Constitutional: He is oriented to person, place, and time. He appears well-developed and well-nourished. No distress.  HENT:  Mouth/Throat: Oropharynx is clear and moist. No oropharyngeal exudate.  Cardiovascular: Normal rate, regular rhythm and normal heart sounds.  Exam reveals no gallop and no friction rub.   No murmur heard. Pulmonary/Chest: Effort normal and breath sounds normal. No respiratory distress. He has no wheezes.  Abdominal: Soft. Bowel sounds are normal. He exhibits no distension. There is no tenderness. There is no rebound.  Lymphadenopathy:    He has no cervical adenopathy.  Neurological: He is  alert and oriented to person, place, and time.  Skin: Skin is warm and dry. No rash noted.  Psychiatric: He has a normal mood and affect. His behavior is normal.          Assessment & Plan:

## 2011-04-26 ENCOUNTER — Other Ambulatory Visit (INDEPENDENT_AMBULATORY_CARE_PROVIDER_SITE_OTHER): Payer: BC Managed Care – PPO

## 2011-04-26 ENCOUNTER — Ambulatory Visit: Payer: BC Managed Care – PPO

## 2011-04-26 ENCOUNTER — Other Ambulatory Visit: Payer: Self-pay | Admitting: *Deleted

## 2011-04-26 DIAGNOSIS — B20 Human immunodeficiency virus [HIV] disease: Secondary | ICD-10-CM

## 2011-04-26 MED ORDER — EFAVIRENZ-EMTRICITAB-TENOFOVIR 600-200-300 MG PO TABS: 1.0000 | ORAL_TABLET | Freq: Every day | ORAL | Status: DC

## 2011-04-27 LAB — COMPLETE METABOLIC PANEL WITH GFR
Alkaline Phosphatase: 81 U/L (ref 39–117)
CO2: 27 mEq/L (ref 19–32)
Creat: 1 mg/dL (ref 0.50–1.35)
GFR, Est African American: >89 mL/min
GFR, Est Non African American: >89 mL/min
Glucose, Bld: 99 mg/dL (ref 70–99)
Sodium: 142 mEq/L (ref 135–145)
Total Bilirubin: 0.3 mg/dL (ref 0.3–1.2)
Total Protein: 7.1 g/dL (ref 6.0–8.3)

## 2011-04-27 LAB — CBC WITH DIFFERENTIAL/PLATELET
Basophils Relative: 1 % (ref 0–1)
HCT: 43.2 % (ref 39.0–52.0)
Hemoglobin: 14.5 g/dL (ref 13.0–17.0)
Lymphocytes Relative: 41 % (ref 12–46)
Lymphs Abs: 1.4 10*3/uL (ref 0.7–4.0)
Monocytes Absolute: 0.2 10*3/uL (ref 0.1–1.0)
Monocytes Relative: 7 % (ref 3–12)
Neutro Abs: 1.7 10*3/uL (ref 1.7–7.7)
Neutrophils Relative %: 50 % (ref 43–77)
RBC: 4.41 MIL/uL (ref 4.22–5.81)
WBC: 3.5 10*3/uL — ABNORMAL LOW (ref 4.0–10.5)

## 2011-04-27 LAB — T-HELPER CELL (CD4) - (RCID CLINIC ONLY): CD4 % Helper T Cell: 38 % (ref 33–55)

## 2011-05-10 ENCOUNTER — Ambulatory Visit (INDEPENDENT_AMBULATORY_CARE_PROVIDER_SITE_OTHER): Payer: Self-pay | Admitting: Internal Medicine

## 2011-05-10 ENCOUNTER — Encounter: Payer: Self-pay | Admitting: Internal Medicine

## 2011-05-10 ENCOUNTER — Other Ambulatory Visit: Payer: Self-pay | Admitting: *Deleted

## 2011-05-10 VITALS — Temp 98.0°F | Wt 179.0 lb

## 2011-05-10 DIAGNOSIS — B20 Human immunodeficiency virus [HIV] disease: Secondary | ICD-10-CM

## 2011-05-10 DIAGNOSIS — Z23 Encounter for immunization: Secondary | ICD-10-CM

## 2011-05-10 NOTE — Patient Instructions (Signed)
Keep it up!

## 2011-05-10 NOTE — Assessment & Plan Note (Signed)
He is doing well and his viral load is down to 45.  I encouraged continued compliance and he is very pleased with his result.  His CD 4 remains around 600.  He will return in 3 months for labs and then follow up appt.  He was reminded to use condoms and I did again discuss the long term effects of HIV meds and the need to make healthy choices inlcluding healthy eating, exercise, and maintaining a good weight.

## 2011-05-10 NOTE — Progress Notes (Signed)
  Subjective:    Patient ID: Jose Olsen, male    DOB: [birth year], 26 y.o.   MRN: [id number]  HPI he is here for follow up after starting Atripla in about November.  He initially started it and did not like the side effects but later decided to restart it and has been taking it well since.  He does report 1 or 2 missed doses but otherwise has good tolerance and adherence.  Today he has no complaints.  No recent hospitilizations, no STIs or other new issues.     Review of Systems  Constitutional: Negative for fever, chills, appetite change, fatigue and unexpected weight change.  HENT: Negative for sore throat and trouble swallowing.   Cardiovascular: Negative for chest pain, palpitations and leg swelling.  Gastrointestinal: Negative for nausea, vomiting, abdominal pain and constipation.  Genitourinary: Negative for discharge and genital sores.  Skin: Negative for pallor and rash.  Neurological: Negative for weakness and headaches.  Hematological: Negative for adenopathy.  Psychiatric/Behavioral: Negative for dysphoric mood. The patient is nervous/anxious.        Objective:   Physical Exam  Constitutional: He appears well-developed and well-nourished. No distress.  HENT:  Mouth/Throat: Oropharynx is clear and moist. No oropharyngeal exudate.  Cardiovascular: Normal rate, regular rhythm and normal heart sounds.  Exam reveals no gallop and no friction rub.   No murmur heard. Pulmonary/Chest: Effort normal and breath sounds normal. No respiratory distress. He has no wheezes. He has no rales.  Abdominal: Soft. Bowel sounds are normal. He exhibits no distension. There is no tenderness. There is no rebound.  Lymphadenopathy:    He has no cervical adenopathy.  Skin: Skin is warm and dry. No rash noted. No erythema.  Psychiatric: He has a normal mood and affect. His behavior is normal.          Assessment & Plan:

## 2011-05-11 ENCOUNTER — Other Ambulatory Visit: Payer: Self-pay | Admitting: *Deleted

## 2011-05-11 DIAGNOSIS — B20 Human immunodeficiency virus [HIV] disease: Secondary | ICD-10-CM

## 2011-05-11 MED ORDER — EFAVIRENZ-EMTRICITAB-TENOFOVIR 600-200-300 MG PO TABS: 1.0000 | ORAL_TABLET | Freq: Every day | ORAL | Status: DC

## 2011-07-28 ENCOUNTER — Other Ambulatory Visit: Payer: Self-pay

## 2011-07-28 DIAGNOSIS — B20 Human immunodeficiency virus [HIV] disease: Secondary | ICD-10-CM

## 2011-07-28 LAB — COMPREHENSIVE METABOLIC PANEL
ALT: 24 U/L (ref 0–53)
AST: 20 U/L (ref 0–37)
Albumin: 4.8 g/dL (ref 3.5–5.2)
CO2: 25 mEq/L (ref 19–32)
Calcium: 10.5 mg/dL (ref 8.4–10.5)
Chloride: 102 mEq/L (ref 96–112)
Creat: 0.99 mg/dL (ref 0.50–1.35)
Potassium: 4.2 mEq/L (ref 3.5–5.3)
Total Protein: 7.5 g/dL (ref 6.0–8.3)

## 2011-07-28 LAB — CBC WITH DIFFERENTIAL/PLATELET
Basophils Absolute: 0 10*3/uL (ref 0.0–0.1)
Eosinophils Relative: 2 % (ref 0–5)
HCT: 47.1 % (ref 39.0–52.0)
Lymphocytes Relative: 48 % — ABNORMAL HIGH (ref 12–46)
Lymphs Abs: 1.9 10*3/uL (ref 0.7–4.0)
MCV: 97.7 fL (ref 78.0–100.0)
Neutro Abs: 1.6 10*3/uL — ABNORMAL LOW (ref 1.7–7.7)
Platelets: 278 10*3/uL (ref 150–400)
RBC: 4.82 MIL/uL (ref 4.22–5.81)
WBC: 4 10*3/uL (ref 4.0–10.5)

## 2011-07-29 LAB — HIV-1 RNA QUANT-NO REFLEX-BLD: HIV 1 RNA Quant: <20 copies/mL (ref <20)

## 2011-08-11 ENCOUNTER — Encounter: Payer: Self-pay | Admitting: Internal Medicine

## 2011-08-11 ENCOUNTER — Ambulatory Visit (INDEPENDENT_AMBULATORY_CARE_PROVIDER_SITE_OTHER): Payer: Self-pay | Admitting: Internal Medicine

## 2011-08-11 ENCOUNTER — Telehealth: Payer: Self-pay | Admitting: *Deleted

## 2011-08-11 ENCOUNTER — Ambulatory Visit: Payer: Self-pay | Admitting: Internal Medicine

## 2011-08-11 ENCOUNTER — Other Ambulatory Visit (INDEPENDENT_AMBULATORY_CARE_PROVIDER_SITE_OTHER): Payer: Self-pay | Admitting: *Deleted

## 2011-08-11 VITALS — BP 123/82 | HR 77 | Temp 97.9°F | Ht 71.0 in | Wt 174.8 lb

## 2011-08-11 DIAGNOSIS — Z0000 Encounter for general adult medical examination without abnormal findings: Secondary | ICD-10-CM

## 2011-08-11 DIAGNOSIS — Z23 Encounter for immunization: Secondary | ICD-10-CM

## 2011-08-11 DIAGNOSIS — B20 Human immunodeficiency virus [HIV] disease: Secondary | ICD-10-CM

## 2011-08-11 NOTE — Telephone Encounter (Signed)
Gave completed dental form to [facility]. He wants his teeth cleaned. I explained there was a substantial waiting list

## 2011-08-11 NOTE — Progress Notes (Signed)
  Subjective:    Patient ID: Jose Olsen, male    DOB: 1986-01-12, 26 y.o.   MRN: [id number]  HPI He comes in for routine followup. He started Atripla in November of 2012 and only took it for a few days but did not like the side effects however he did restart it and once he got through the initial fracture he now has been asymptomatic. He reports excellent adherence with 100% compliance and his viral load now is undetectable. Patient is pleased with the results. He has had no recent STI's or hospitalizations. He is pleased with his progress.   Review of Systems  Constitutional: Negative.   HENT: Negative for sore throat and trouble swallowing.   Respiratory: Negative for cough, shortness of breath and wheezing.   Cardiovascular: Negative for chest pain, palpitations and leg swelling.  Gastrointestinal: Negative for nausea, vomiting, abdominal pain, diarrhea and constipation.  Genitourinary: Negative.   Musculoskeletal: Negative for myalgias, joint swelling and arthralgias.  Skin: Negative for pallor and rash.  Neurological: Negative for weakness and headaches.  Hematological: Negative for adenopathy.  Psychiatric/Behavioral: Negative for dysphoric mood. The patient is not nervous/anxious.        Objective:   Physical Exam  Constitutional: He appears well-developed and well-nourished. No distress.  HENT:  Mouth/Throat: Oropharynx is clear and moist. No oropharyngeal exudate.  Cardiovascular: Normal rate, regular rhythm and normal heart sounds.  Exam reveals no gallop and no friction rub.   No murmur heard. Pulmonary/Chest: Effort normal and breath sounds normal. No respiratory distress. He has no wheezes. He has no rales.  Abdominal: Soft. Bowel sounds are normal. He exhibits no distension. There is no tenderness. There is no rebound.  Lymphadenopathy:    He has no cervical adenopathy.  Skin: Skin is warm and dry. No rash noted. No erythema.  Psychiatric: He has a normal mood and  affect. His behavior is normal.          Assessment & Plan:

## 2011-08-11 NOTE — Assessment & Plan Note (Signed)
He continues to do well and will continue with Atripla. He is pleased with being undetectable. He is now up-to-date with his vaccinations. He will return in about 4 months. He was reminded to use condoms with all sexual activity and was given condoms. He knows that being undetectable does not mean that he can't infect someone else and he understands that he does not want to get other sexual transmitted infections.

## 2011-08-11 NOTE — Patient Instructions (Signed)
RTC 4 months, keep it up!

## 2011-11-23 ENCOUNTER — Other Ambulatory Visit (INDEPENDENT_AMBULATORY_CARE_PROVIDER_SITE_OTHER): Payer: Self-pay

## 2011-11-23 DIAGNOSIS — B20 Human immunodeficiency virus [HIV] disease: Secondary | ICD-10-CM

## 2011-11-23 LAB — CBC WITH DIFFERENTIAL/PLATELET
Hemoglobin: 15.2 g/dL (ref 13.0–17.0)
Lymphs Abs: 2.1 10*3/uL (ref 0.7–4.0)
Monocytes Relative: 10 % (ref 3–12)
Neutro Abs: 2 10*3/uL (ref 1.7–7.7)
Neutrophils Relative %: 41 % — ABNORMAL LOW (ref 43–77)
RBC: 4.58 MIL/uL (ref 4.22–5.81)

## 2011-11-23 LAB — COMPREHENSIVE METABOLIC PANEL
Alkaline Phosphatase: 81 U/L (ref 39–117)
BUN: 10 mg/dL (ref 6–23)
Glucose, Bld: 120 mg/dL — ABNORMAL HIGH (ref 70–99)
Sodium: 142 mEq/L (ref 135–145)
Total Bilirubin: 0.5 mg/dL (ref 0.3–1.2)

## 2011-11-24 LAB — T-HELPER CELL (CD4) - (RCID CLINIC ONLY): CD4 % Helper T Cell: 39 % (ref 33–55)

## 2011-11-24 LAB — HIV-1 RNA QUANT-NO REFLEX-BLD
HIV 1 RNA Quant: <20 copies/mL (ref <20)
HIV-1 RNA Quant, Log: <1.3 {Log} (ref <1.30)

## 2011-11-29 ENCOUNTER — Other Ambulatory Visit: Payer: Self-pay

## 2011-11-29 ENCOUNTER — Ambulatory Visit: Payer: Self-pay

## 2011-12-06 ENCOUNTER — Other Ambulatory Visit: Payer: Self-pay | Admitting: *Deleted

## 2011-12-06 DIAGNOSIS — Z113 Encounter for screening for infections with a predominantly sexual mode of transmission: Secondary | ICD-10-CM

## 2011-12-13 ENCOUNTER — Encounter: Payer: Self-pay | Admitting: Internal Medicine

## 2011-12-13 ENCOUNTER — Ambulatory Visit (INDEPENDENT_AMBULATORY_CARE_PROVIDER_SITE_OTHER): Payer: Self-pay | Admitting: Internal Medicine

## 2011-12-13 VITALS — BP 131/88 | HR 82 | Temp 98.0°F | Ht 71.0 in | Wt 179.0 lb

## 2011-12-13 DIAGNOSIS — R197 Diarrhea, unspecified: Secondary | ICD-10-CM

## 2011-12-13 DIAGNOSIS — B20 Human immunodeficiency virus [HIV] disease: Secondary | ICD-10-CM

## 2011-12-13 DIAGNOSIS — Z23 Encounter for immunization: Secondary | ICD-10-CM

## 2011-12-13 NOTE — Progress Notes (Signed)
  Subjective:    Patient ID: Jose Olsen, male    DOB: [birth year], 26 y.o.   MRN: [id number]  HPI He comes in for routine followup. He continues to take Atripla and has no complaints. He denies any missed doses and remains asymptomatic. He is pleased with his continued viral suppression. No recent issues.   Review of Systems  Constitutional: Negative for fever, fatigue and unexpected weight change.  HENT: Negative for sore throat and trouble swallowing.   Respiratory: Negative for cough and shortness of breath.   Cardiovascular: Negative for chest pain, palpitations and leg swelling.  Gastrointestinal: Negative for nausea, abdominal pain and diarrhea.  Musculoskeletal: Negative for myalgias, joint swelling and arthralgias.  Skin: Negative for rash.  Neurological: Negative for dizziness and headaches.       Objective:   Physical Exam  Constitutional: He appears well-developed and well-nourished. No distress.  HENT:  Mouth/Throat: Oropharynx is clear and moist. No oropharyngeal exudate.  Cardiovascular: Normal rate, regular rhythm and normal heart sounds.  Exam reveals no gallop and no friction rub.   No murmur heard. Pulmonary/Chest: Breath sounds normal. He has no wheezes. He has no rales.  Lymphadenopathy:    He has no cervical adenopathy.          Assessment & Plan:

## 2011-12-13 NOTE — Assessment & Plan Note (Signed)
He is doing well with his regimen and will continue. He has good compliance and this was encouraged. He will return in 6 months for repeat labs.

## 2011-12-13 NOTE — Assessment & Plan Note (Signed)
He does have continued problems with chronic diarrhea. He has 3-4 or more loose stools daily. He does had no weight loss, fever or chills. This has been ongoing prior to starting the medications. No bloating, tenesmus or other associated symptoms. I will check stool for O&P for parasite related illnesses related to HIV and also stool culture. If this is negative, I will refer to gastroenterology.

## 2011-12-29 ENCOUNTER — Ambulatory Visit: Payer: Self-pay | Admitting: Infectious Diseases

## 2012-05-17 ENCOUNTER — Other Ambulatory Visit (INDEPENDENT_AMBULATORY_CARE_PROVIDER_SITE_OTHER): Payer: Self-pay

## 2012-05-17 ENCOUNTER — Ambulatory Visit: Payer: Self-pay

## 2012-05-17 ENCOUNTER — Other Ambulatory Visit: Payer: Self-pay | Admitting: Internal Medicine

## 2012-05-17 DIAGNOSIS — B20 Human immunodeficiency virus [HIV] disease: Secondary | ICD-10-CM

## 2012-05-17 DIAGNOSIS — Z113 Encounter for screening for infections with a predominantly sexual mode of transmission: Secondary | ICD-10-CM

## 2012-05-17 NOTE — Addendum Note (Signed)
Addended by: Mariea Clonts D on: 05/17/2012 10:34 AM   Modules accepted: Orders

## 2012-05-21 LAB — HIV-1 RNA QUANT-NO REFLEX-BLD
HIV 1 RNA Quant: <20 copies/mL (ref <20)
HIV-1 RNA Quant, Log: <1.3 {Log} (ref <1.30)

## 2012-06-05 ENCOUNTER — Other Ambulatory Visit: Payer: Self-pay

## 2012-06-18 ENCOUNTER — Telehealth: Payer: Self-pay | Admitting: *Deleted

## 2012-06-18 NOTE — Telephone Encounter (Signed)
Patient needs updated phone information Jose Olsen

## 2012-06-19 ENCOUNTER — Ambulatory Visit: Payer: Self-pay | Admitting: Internal Medicine

## 2012-06-21 ENCOUNTER — Ambulatory Visit (INDEPENDENT_AMBULATORY_CARE_PROVIDER_SITE_OTHER): Payer: Self-pay | Admitting: Internal Medicine

## 2012-06-21 ENCOUNTER — Encounter: Payer: Self-pay | Admitting: Internal Medicine

## 2012-06-21 VITALS — BP 144/82 | HR 83 | Temp 98.5°F | Ht 70.0 in | Wt 177.0 lb

## 2012-06-21 DIAGNOSIS — B20 Human immunodeficiency virus [HIV] disease: Secondary | ICD-10-CM

## 2012-06-21 DIAGNOSIS — Z79899 Other long term (current) drug therapy: Secondary | ICD-10-CM

## 2012-06-21 DIAGNOSIS — Z113 Encounter for screening for infections with a predominantly sexual mode of transmission: Secondary | ICD-10-CM

## 2012-06-21 MED ORDER — EFAVIRENZ-EMTRICITAB-TENOFOVIR 600-200-300 MG PO TABS: 1.0000 | ORAL_TABLET | Freq: Every day | ORAL | Status: DC

## 2012-06-21 NOTE — Assessment & Plan Note (Signed)
He is doing well and will return in 6 months for routine followup.

## 2012-06-21 NOTE — Progress Notes (Signed)
  Subjective:    Patient ID: Jose Olsen, male    DOB: [birth year], 27 y.o.   MRN: [id number]  HPI He comes in for followup of his HIV. He continues to take Atripla and has an undetectable viral load and CD4 count. He started back in 2012 the left are initially not taken it did start and has been having good compliance with no side effects. He feels well no new issues. He did complain of diarrhea last visit however that has since resolved.   Review of Systems  Constitutional: Negative for appetite change and fatigue.  HENT: Negative for sore throat and trouble swallowing.   Respiratory: Negative for cough and shortness of breath.   Cardiovascular: Negative for leg swelling.  Gastrointestinal: Negative for nausea, vomiting, diarrhea and constipation.  Musculoskeletal: Negative for myalgias, joint swelling and arthralgias.  Skin: Negative for rash.  Neurological: Negative for dizziness, light-headedness and headaches.       Objective:   Physical Exam  Constitutional: He appears well-developed and well-nourished. No distress.  HENT:  Mouth/Throat: Oropharynx is clear and moist. No oropharyngeal exudate.  Cardiovascular: Normal rate, regular rhythm and normal heart sounds.  Exam reveals no gallop and no friction rub.   No murmur heard. Pulmonary/Chest: Effort normal and breath sounds normal. No respiratory distress. He has no wheezes. He has no rales.  Abdominal: Soft. Bowel sounds are normal. He exhibits no distension. There is no tenderness. There is no rebound.  Lymphadenopathy:    He has no cervical adenopathy.          Assessment & Plan:

## 2012-06-30 ENCOUNTER — Emergency Department (HOSPITAL_BASED_OUTPATIENT_CLINIC_OR_DEPARTMENT_OTHER): Payer: No Typology Code available for payment source

## 2012-06-30 ENCOUNTER — Emergency Department (HOSPITAL_BASED_OUTPATIENT_CLINIC_OR_DEPARTMENT_OTHER)
Admission: EM | Admit: 2012-06-30 | Discharge: 2012-06-30 | Disposition: A | Discharge status: Home / Self Care | Payer: No Typology Code available for payment source | Attending: Emergency Medicine | Admitting: Emergency Medicine

## 2012-06-30 ENCOUNTER — Encounter (HOSPITAL_BASED_OUTPATIENT_CLINIC_OR_DEPARTMENT_OTHER): Payer: Self-pay | Admitting: *Deleted

## 2012-06-30 DIAGNOSIS — Y9389 Activity, other specified: Secondary | ICD-10-CM | POA: Insufficient documentation

## 2012-06-30 DIAGNOSIS — Z21 Asymptomatic human immunodeficiency virus [HIV] infection status: Secondary | ICD-10-CM | POA: Insufficient documentation

## 2012-06-30 DIAGNOSIS — IMO0002 Reserved for concepts with insufficient information to code with codable children: Secondary | ICD-10-CM | POA: Insufficient documentation

## 2012-06-30 DIAGNOSIS — Y92410 Unspecified street and highway as the place of occurrence of the external cause: Secondary | ICD-10-CM | POA: Insufficient documentation

## 2012-06-30 DIAGNOSIS — S43402A Unspecified sprain of left shoulder joint, initial encounter: Secondary | ICD-10-CM

## 2012-06-30 DIAGNOSIS — T07XXXA Unspecified multiple injuries, initial encounter: Secondary | ICD-10-CM

## 2012-06-30 DIAGNOSIS — S8000XA Contusion of unspecified knee, initial encounter: Secondary | ICD-10-CM | POA: Insufficient documentation

## 2012-06-30 DIAGNOSIS — S8002XA Contusion of left knee, initial encounter: Secondary | ICD-10-CM

## 2012-06-30 MED ORDER — OXYCODONE-ACETAMINOPHEN 5-325 MG PO TABS: 2.0000 | ORAL_TABLET | ORAL | Status: DC | PRN

## 2012-06-30 MED ORDER — OXYCODONE-ACETAMINOPHEN 5-325 MG PO TABS
2.0000 | ORAL_TABLET | Freq: Once | ORAL | Status: AC
  Administered 2012-06-30: 2 via ORAL
  Filled 2012-06-30 (×2): qty 2
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
## 2012-06-30 NOTE — ED Notes (Signed)
Patient transported to X-ray 

## 2012-06-30 NOTE — ED Notes (Signed)
I completed ace wrap to patient's left knee over sock material. I then fit and adjusted arm sling. There was no charge for ace wrap under new procedure for ortho services. Patient was discharged.
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
## 2012-12-13 ENCOUNTER — Other Ambulatory Visit: Payer: Self-pay

## 2012-12-14 ENCOUNTER — Other Ambulatory Visit: Payer: BC Managed Care – PPO

## 2012-12-14 DIAGNOSIS — Z113 Encounter for screening for infections with a predominantly sexual mode of transmission: Secondary | ICD-10-CM

## 2012-12-14 DIAGNOSIS — Z79899 Other long term (current) drug therapy: Secondary | ICD-10-CM

## 2012-12-14 DIAGNOSIS — B20 Human immunodeficiency virus [HIV] disease: Secondary | ICD-10-CM

## 2012-12-14 LAB — CBC WITH DIFFERENTIAL/PLATELET
Basophils Relative: 0 % (ref 0–1)
Eosinophils Absolute: 0.1 10*3/uL (ref 0.0–0.7)
Lymphocytes Relative: 51 % — ABNORMAL HIGH (ref 12–46)
Lymphs Abs: 2.3 10*3/uL (ref 0.7–4.0)
Neutrophils Relative %: 36 % — ABNORMAL LOW (ref 43–77)
Platelets: 278 10*3/uL (ref 150–400)
RBC: 4.76 MIL/uL (ref 4.22–5.81)
RDW: 13.3 % (ref 11.5–15.5)
WBC: 4.5 10*3/uL (ref 4.0–10.5)

## 2012-12-14 LAB — COMPLETE METABOLIC PANEL WITH GFR
ALT: 15 U/L (ref 0–53)
AST: 20 U/L (ref 0–37)
Albumin: 5.1 g/dL (ref 3.5–5.2)
Alkaline Phosphatase: 63 U/L (ref 39–117)
BUN: 11 mg/dL (ref 6–23)
Calcium: 10.2 mg/dL (ref 8.4–10.5)
Chloride: 101 mEq/L (ref 96–112)
Potassium: 4.6 mEq/L (ref 3.5–5.3)
Sodium: 141 mEq/L (ref 135–145)
Total Protein: 7.8 g/dL (ref 6.0–8.3)

## 2012-12-14 LAB — LIPID PANEL
HDL: 41 mg/dL (ref >39)
LDL Cholesterol: 126 mg/dL — ABNORMAL HIGH (ref 0–99)
Total CHOL/HDL Ratio: 4.5 Ratio
VLDL: 19 mg/dL (ref 0–40)

## 2012-12-14 LAB — T-HELPER CELL (CD4) - (RCID CLINIC ONLY): CD4 % Helper T Cell: 33 % (ref 33–55)

## 2012-12-17 LAB — HIV-1 RNA QUANT-NO REFLEX-BLD
HIV 1 RNA Quant: 506 copies/mL — ABNORMAL HIGH (ref <20)
HIV-1 RNA Quant, Log: 2.7 {Log} — ABNORMAL HIGH (ref <1.30)

## 2012-12-27 ENCOUNTER — Encounter: Payer: Self-pay | Admitting: Internal Medicine

## 2012-12-27 ENCOUNTER — Ambulatory Visit (INDEPENDENT_AMBULATORY_CARE_PROVIDER_SITE_OTHER): Payer: No Typology Code available for payment source | Admitting: Internal Medicine

## 2012-12-27 ENCOUNTER — Other Ambulatory Visit: Payer: Self-pay | Admitting: Licensed Clinical Social Worker

## 2012-12-27 VITALS — BP 128/80 | HR 78 | Temp 98.2°F | Ht 69.0 in | Wt 182.0 lb

## 2012-12-27 DIAGNOSIS — B20 Human immunodeficiency virus [HIV] disease: Secondary | ICD-10-CM

## 2012-12-27 DIAGNOSIS — Z23 Encounter for immunization: Secondary | ICD-10-CM

## 2012-12-27 MED ORDER — ELVITEG-COBIC-EMTRICIT-TENOFDF 150-150-200-300 MG PO TABS: 1.0000 | ORAL_TABLET | Freq: Every day | ORAL | Status: DC

## 2012-12-27 NOTE — Progress Notes (Signed)
  Subjective:    Patient ID: Jose Olsen, male    DOB: 1985-04-05, 27 y.o.   MRN: [id number]  HPI He comes in for followup of his HIV. He has been on Atripla and had excellent compliance and good tolerance however unfortunately, he decided to stop due to the side effects interfering with his work yet did not call or make a followup appointment to consider changes. He stopped in June. His labs do show detectable virus.  He is still interested in treatment and is worried about being off treatment. No weight loss, no diarrhea. No new issues.   Review of Systems  Constitutional: Negative for fever, fatigue and unexpected weight change.  HENT: Negative for sneezing and sore throat.   Eyes: Negative for visual disturbance.  Respiratory: Negative for shortness of breath.   Cardiovascular: Negative for leg swelling.  Gastrointestinal: Negative for abdominal distention.  Skin: Negative for rash.  Neurological: Negative for dizziness, light-headedness and headaches.  Hematological: Negative for adenopathy.  Psychiatric/Behavioral: Negative for dysphoric mood.       Objective:   Physical Exam  Constitutional: He appears well-developed and well-nourished. No distress.  HENT:  Mouth/Throat: No oropharyngeal exudate.  Eyes: No scleral icterus.  Cardiovascular: Normal rate, regular rhythm and normal heart sounds.   No murmur heard. Pulmonary/Chest: Effort normal and breath sounds normal. No respiratory distress.  Abdominal: Soft. Bowel sounds are normal. He exhibits no distension.  Musculoskeletal: He exhibits no edema.  Lymphadenopathy:    He has no cervical adenopathy.  Neurological: He is alert.  Skin: No rash noted.          Assessment & Plan:

## 2012-12-27 NOTE — Assessment & Plan Note (Signed)
I have discussed the options and will change him to Stribild. He was visited by the pharmacist as well. He understands the side effects. He will have labs and 3 weeks and I will see him after his labs

## 2013-01-17 ENCOUNTER — Other Ambulatory Visit: Payer: Self-pay

## 2013-01-25 ENCOUNTER — Other Ambulatory Visit (INDEPENDENT_AMBULATORY_CARE_PROVIDER_SITE_OTHER): Payer: Self-pay

## 2013-01-25 DIAGNOSIS — B20 Human immunodeficiency virus [HIV] disease: Secondary | ICD-10-CM

## 2013-01-25 LAB — CBC WITH DIFFERENTIAL/PLATELET
Basophils Relative: 0 % (ref 0–1)
HCT: 42.9 % (ref 39.0–52.0)
Hemoglobin: 15 g/dL (ref 13.0–17.0)
MCHC: 35 g/dL (ref 30.0–36.0)
Monocytes Absolute: 0.4 10*3/uL (ref 0.1–1.0)
Monocytes Relative: 9 % (ref 3–12)
Neutro Abs: 1.9 10*3/uL (ref 1.7–7.7)

## 2013-01-25 LAB — COMPLETE METABOLIC PANEL WITH GFR
Albumin: 4.7 g/dL (ref 3.5–5.2)
Alkaline Phosphatase: 57 U/L (ref 39–117)
BUN: 14 mg/dL (ref 6–23)
CO2: 28 mEq/L (ref 19–32)
Calcium: 9.7 mg/dL (ref 8.4–10.5)
Chloride: 102 mEq/L (ref 96–112)
GFR, Est African American: >89 mL/min
GFR, Est Non African American: >89 mL/min
Glucose, Bld: 83 mg/dL (ref 70–99)
Potassium: 4.1 mEq/L (ref 3.5–5.3)

## 2013-01-28 ENCOUNTER — Ambulatory Visit: Payer: Self-pay | Admitting: Internal Medicine

## 2013-01-28 LAB — HIV-1 RNA QUANT-NO REFLEX-BLD: HIV 1 RNA Quant: <20 copies/mL (ref <20)

## 2013-01-31 ENCOUNTER — Telehealth: Payer: Self-pay | Admitting: *Deleted

## 2013-01-31 NOTE — Telephone Encounter (Signed)
Pt needs to call [facility] to arrange delivery of medications.  [phone]

## 2013-02-04 ENCOUNTER — Ambulatory Visit: Payer: Self-pay | Admitting: Internal Medicine

## 2013-02-04 ENCOUNTER — Telehealth: Payer: Self-pay | Admitting: *Deleted

## 2013-02-04 NOTE — Telephone Encounter (Signed)
Called patient and left voice mail for him to call the clinic to reschedule his appt, he no showed today. Jose Olsen

## 2013-07-19 ENCOUNTER — Emergency Department (HOSPITAL_COMMUNITY)
Admission: EM | Admit: 2013-07-19 | Discharge: 2013-07-19 | Disposition: A | Discharge status: Home / Self Care | Payer: BC Managed Care – PPO | Attending: Emergency Medicine | Admitting: Emergency Medicine

## 2013-07-19 ENCOUNTER — Encounter (HOSPITAL_COMMUNITY): Payer: Self-pay | Admitting: Emergency Medicine

## 2013-07-19 DIAGNOSIS — Z79899 Other long term (current) drug therapy: Secondary | ICD-10-CM | POA: Insufficient documentation

## 2013-07-19 DIAGNOSIS — Z8619 Personal history of other infectious and parasitic diseases: Secondary | ICD-10-CM | POA: Insufficient documentation

## 2013-07-19 DIAGNOSIS — B9789 Other viral agents as the cause of diseases classified elsewhere: Secondary | ICD-10-CM | POA: Insufficient documentation

## 2013-07-19 DIAGNOSIS — Z21 Asymptomatic human immunodeficiency virus [HIV] infection status: Secondary | ICD-10-CM | POA: Insufficient documentation

## 2013-07-19 DIAGNOSIS — B349 Viral infection, unspecified: Secondary | ICD-10-CM

## 2013-07-19 LAB — COMPREHENSIVE METABOLIC PANEL
ALK PHOS: 81 U/L (ref 39–117)
ALT: 22 U/L (ref 0–53)
AST: 39 U/L — ABNORMAL HIGH (ref 0–37)
Albumin: 4.5 g/dL (ref 3.5–5.2)
BILIRUBIN TOTAL: 0.5 mg/dL (ref 0.3–1.2)
BUN: 14 mg/dL (ref 6–23)
CHLORIDE: 98 meq/L (ref 96–112)
CO2: 27 mEq/L (ref 19–32)
Calcium: 9.8 mg/dL (ref 8.4–10.5)
Creatinine, Ser: 1.07 mg/dL (ref 0.50–1.35)
GFR calc non Af Amer: >90 mL/min (ref >90)
GLUCOSE: 99 mg/dL (ref 70–99)
POTASSIUM: 3.9 meq/L (ref 3.7–5.3)
SODIUM: 137 meq/L (ref 137–147)
TOTAL PROTEIN: 8.2 g/dL (ref 6.0–8.3)

## 2013-07-19 LAB — CBC WITH DIFFERENTIAL/PLATELET
Basophils Absolute: 0 10*3/uL (ref 0.0–0.1)
Basophils Relative: 0 % (ref 0–1)
EOS ABS: 0.1 10*3/uL (ref 0.0–0.7)
Eosinophils Relative: 1 % (ref 0–5)
HCT: 48.7 % (ref 39.0–52.0)
HEMOGLOBIN: 17.1 g/dL — AB (ref 13.0–17.0)
LYMPHS ABS: 1.7 10*3/uL (ref 0.7–4.0)
LYMPHS PCT: 26 % (ref 12–46)
MCH: 32.5 pg (ref 26.0–34.0)
MCHC: 35.1 g/dL (ref 30.0–36.0)
MCV: 92.6 fL (ref 78.0–100.0)
Monocytes Absolute: 0.6 10*3/uL (ref 0.1–1.0)
Monocytes Relative: 9 % (ref 3–12)
NEUTROS ABS: 4.2 10*3/uL (ref 1.7–7.7)
NEUTROS PCT: 65 % (ref 43–77)
PLATELETS: 241 10*3/uL (ref 150–400)
RBC: 5.26 MIL/uL (ref 4.22–5.81)
RDW: 11.8 % (ref 11.5–15.5)
WBC: 6.5 10*3/uL (ref 4.0–10.5)

## 2013-07-19 LAB — URINALYSIS, ROUTINE W REFLEX MICROSCOPIC
BILIRUBIN URINE: NEGATIVE
Glucose, UA: NEGATIVE mg/dL
HGB URINE DIPSTICK: NEGATIVE
Ketones, ur: NEGATIVE mg/dL
Leukocytes, UA: NEGATIVE
Nitrite: NEGATIVE
PH: 5.5 (ref 5.0–8.0)
Protein, ur: NEGATIVE mg/dL
SPECIFIC GRAVITY, URINE: 1.028 (ref 1.005–1.030)
UROBILINOGEN UA: 0.2 mg/dL (ref 0.0–1.0)

## 2013-07-19 LAB — LIPASE, BLOOD: Lipase: 28 U/L (ref 11–59)

## 2013-07-19 MED ORDER — SODIUM CHLORIDE 0.9 % IV BOLUS (SEPSIS)
1000.0000 mL | Freq: Once | INTRAVENOUS | Status: AC
  Administered 2013-07-19: 1000 mL via INTRAVENOUS

## 2013-07-19 MED ORDER — PROMETHAZINE HCL 25 MG PO TABS: 25.0000 mg | ORAL_TABLET | Freq: Four times a day (QID) | ORAL | Status: DC | PRN

## 2013-07-19 MED ORDER — PROMETHAZINE HCL 25 MG/ML IJ SOLN
25.0000 mg | Freq: Once | INTRAMUSCULAR | Status: AC
  Administered 2013-07-19: 25 mg via INTRAVENOUS
  Filled 2013-07-19: qty 1

## 2013-07-19 MED ORDER — ONDANSETRON HCL 4 MG/2ML IJ SOLN
4.0000 mg | Freq: Once | INTRAMUSCULAR | Status: AC
  Administered 2013-07-19: 4 mg via INTRAVENOUS
  Filled 2013-07-19: qty 2

## 2013-07-19 NOTE — ED Provider Notes (Signed)
CSN: [id number]     Arrival date & time 07/19/13  0254 History   First MD Initiated Contact with Patient 07/19/13 0304     Chief Complaint  Patient presents with  . Emesis  . Diarrhea     (Consider location/radiation/quality/duration/timing/severity/associated sxs/prior Treatment) HPI Comments: Patient is a 28 year old male with a past medical history of HIV who presents with a 3 day history of abdominal pain. The pain is located in his generalized abdomen and does not radiate. The pain is described as cramping and moderate. The pain started gradually and progressively worsened since the onset. No alleviating/aggravating factors. The patient has tried pepto bismol for symptoms without relief. Associated symptoms include NVD. Patient denies fever, headache, chest pain, SOB, dysuria, constipation. [name] last CD4 count was 780 which was 6 months ago.    Past Medical History  Diagnosis Date  . Chicken pox   . HIV infection    History reviewed. No pertinent past surgical history. Family History  Problem Relation Age of Onset  . Diabetes Paternal Uncle   . Diabetes Maternal Grandmother   . Hypertension Maternal Grandmother   . Stroke Maternal Grandmother   . Cancer Other     prostate   History  Substance Use Topics  . Smoking status: Never Smoker   . Smokeless tobacco: Never Used  . Alcohol Use: 0.0 oz/week     Comment: rarely    Review of Systems  Constitutional: Negative for fever, chills and fatigue.  HENT: Negative for trouble swallowing.   Eyes: Negative for visual disturbance.  Respiratory: Negative for shortness of breath.   Cardiovascular: Negative for chest pain and palpitations.  Gastrointestinal: Positive for nausea, vomiting, abdominal pain and diarrhea.  Genitourinary: Negative for dysuria and difficulty urinating.  Musculoskeletal: Negative for arthralgias and neck pain.  Skin: Negative for color change.  Neurological: Negative for dizziness and weakness.   Psychiatric/Behavioral: Negative for dysphoric mood.      Allergies  Review of patient's allergies indicates no known allergies.  Home Medications   Prior to Admission medications   Medication Sig Start Date End Date Taking? Authorizing Provider  elvitegravir-cobicistat-emtricitabine-tenofovir (STRIBILD) 150-150-200-300 MG TABS tablet Take 1 tablet by mouth daily. 12/27/12  Yes Gardiner Barefootobert W Comer, MD   BP 135/72  Pulse 103  Temp(Src) 98.8 F (37.1 C) (Oral)  Resp 16  Ht 5\' 9"  (1.753 m)  Wt 185 lb (83.915 kg)  BMI 27.31 kg/m2  SpO2 97% Physical Exam  Nursing note and vitals reviewed. Constitutional: He is oriented to person, place, and time. He appears well-developed and well-nourished. No distress.  HENT:  Head: Normocephalic and atraumatic.  Eyes: Conjunctivae and EOM are normal.  Neck: Normal range of motion.  Cardiovascular: Normal rate and regular rhythm.  Exam reveals no gallop and no friction rub.   No murmur heard. Pulmonary/Chest: Effort normal and breath sounds normal. He has no wheezes. He has no rales. He exhibits no tenderness.  Abdominal: Soft. He exhibits no distension. There is tenderness. There is no rebound and no guarding.  Mild lower abdominal tenderness to palpation. No focal tenderness or peritoneal signs.   Musculoskeletal: Normal range of motion.  Neurological: He is alert and oriented to person, place, and time. Coordination normal.  Speech is goal-oriented. Moves limbs without ataxia.   Skin: Skin is warm and dry.  Psychiatric: He has a normal mood and affect. His behavior is normal.    ED Course  Procedures (including critical care time) Labs Review Labs  Reviewed  CBC WITH DIFFERENTIAL - Abnormal; Notable for the following:    Hemoglobin 17.1 (*)    All other components within normal limits  COMPREHENSIVE METABOLIC PANEL - Abnormal; Notable for the following:    AST 39 (*)    All other components within normal limits  LIPASE, BLOOD   URINALYSIS, ROUTINE W REFLEX MICROSCOPIC    Imaging Review No results found.   EKG Interpretation None      MDM   Final diagnoses:  Viral illness    3:18 AM Labs pending. Patient is mildly tachycardic. Patient will have fluids and zofran.   5:22 AM Labs and urinalysis unremarkable for acute changes. Vitals stable and patient afebrile. Patient reports relief of of his nausea with phenergan. I will discharge the patient with phenergan and instruct him to follow up with his PCP. Patient advised to return with worsening or concerning symptoms.     Emilia BeckKaitlyn Liz Pinho, [place] JerseyPA-C 07/19/13 [phone]

## 2013-07-19 NOTE — ED Provider Notes (Signed)
Medical screening examination/treatment/procedure(s) were performed by non-physician practitioner and as supervising physician I was immediately available for consultation/collaboration.   EKG Interpretation None        Gelene Recktenwald M CampLyanne Coos, MD 07/19/13 [phone]

## 2013-07-19 NOTE — ED Notes (Signed)
PT states that he has been having vomiting and diarrhea x 3 days; pt reports 4 episodes if vomiting in the last 24hrs and multiple episodes of diarrhea; pt reports up to 4 episodes an hour of diarrhea; pt denies abd pain just cramping with the vomiting and diarrhea; pt denies blood to diarrhea

## 2013-07-19 NOTE — Discharge Instructions (Signed)
Take Phenergan as needed for nausea. Refer to attached documents for more information. Return to the ED with worsening or concerning symptoms.  °

## 2013-08-05 ENCOUNTER — Encounter: Payer: Self-pay | Admitting: *Deleted

## 2013-10-10 ENCOUNTER — Encounter (HOSPITAL_COMMUNITY): Payer: Self-pay | Admitting: Emergency Medicine

## 2013-10-10 ENCOUNTER — Emergency Department (HOSPITAL_COMMUNITY)
Admission: EM | Admit: 2013-10-10 | Discharge: 2013-10-10 | Disposition: A | Discharge status: Home / Self Care | Payer: BC Managed Care – PPO | Attending: Emergency Medicine | Admitting: Emergency Medicine

## 2013-10-10 DIAGNOSIS — A64 Unspecified sexually transmitted disease: Secondary | ICD-10-CM | POA: Insufficient documentation

## 2013-10-10 DIAGNOSIS — A630 Anogenital (venereal) warts: Secondary | ICD-10-CM | POA: Insufficient documentation

## 2013-10-10 DIAGNOSIS — K6289 Other specified diseases of anus and rectum: Secondary | ICD-10-CM | POA: Insufficient documentation

## 2013-10-10 DIAGNOSIS — Z79899 Other long term (current) drug therapy: Secondary | ICD-10-CM | POA: Insufficient documentation

## 2013-10-10 DIAGNOSIS — Z21 Asymptomatic human immunodeficiency virus [HIV] infection status: Secondary | ICD-10-CM | POA: Insufficient documentation

## 2013-10-10 MED ORDER — LIDOCAINE HCL 1 % IJ SOLN
INTRAMUSCULAR | Status: AC
  Administered 2013-10-10: 0.9 mL
  Filled 2013-10-10: qty 20

## 2013-10-10 MED ORDER — CEFTRIAXONE SODIUM 250 MG IJ SOLR
250.0000 mg | Freq: Once | INTRAMUSCULAR | Status: AC
  Administered 2013-10-10: 250 mg via INTRAMUSCULAR
  Filled 2013-10-10: qty 250

## 2013-10-10 MED ORDER — DOXYCYCLINE HYCLATE 100 MG PO CAPS: 100.0000 mg | ORAL_CAPSULE | Freq: Two times a day (BID) | ORAL | Status: DC

## 2013-10-10 NOTE — ED Notes (Signed)
Patient is here from home. Patient states his partner was treated for gonorrhea Monday. Patient states his anus has been itching and he noticed mucous discharge today. Patient denies having multiple sex partners.

## 2013-10-10 NOTE — Discharge Instructions (Signed)
Please take the antibiotics prescribed and followup with your doctors for continued evaluation and treatment.   Sexually Transmitted Disease A sexually transmitted disease (STD) is a disease or infection that may be passed (transmitted) from person to person, usually during sexual activity. This may happen by way of saliva, semen, blood, vaginal mucus, or urine. Common STDs include:   Gonorrhea.   Chlamydia.   Syphilis.   HIV and AIDS.   Genital herpes.   Hepatitis B and C.   Trichomonas.   Human papillomavirus (HPV).   Pubic lice.   Scabies.  Mites.  Bacterial vaginosis. WHAT ARE CAUSES OF STDs? An STD may be caused by bacteria, a virus, or parasites. STDs are often transmitted during sexual activity if one person is infected. However, they may also be transmitted through nonsexual means. STDs may be transmitted after:   Sexual intercourse with an infected person.   Sharing sex toys with an infected person.   Sharing needles with an infected person or using unclean piercing or tattoo needles.  Having intimate contact with the genitals, mouth, or rectal areas of an infected person.   Exposure to infected fluids during birth. WHAT ARE THE SIGNS AND SYMPTOMS OF STDs? Different STDs have different symptoms. Some people may not have any symptoms. If symptoms are present, they may include:   Painful or bloody urination.   Pain in the pelvis, abdomen, vagina, anus, throat, or eyes.   A skin rash, itching, or irritation.  Growths, ulcerations, blisters, or sores in the genital and anal areas.  Abnormal vaginal discharge with or without bad odor.   Penile discharge in men.   Fever.   Pain or bleeding during sexual intercourse.   Swollen glands in the groin area.   Yellow skin and eyes (jaundice). This is seen with hepatitis.   Swollen testicles.  Infertility.  Sores and blisters in the mouth. HOW ARE STDs DIAGNOSED? To make a  diagnosis, your health care provider may:   Take a medical history.   Perform a physical exam.   Take a sample of any discharge to examine.  Swab the throat, cervix, opening to the penis, rectum, or vagina for testing.  Test a sample of your first morning urine.   Perform blood tests.   Perform a Pap test, if this applies.   Perform a colposcopy.   Perform a laparoscopy.  HOW ARE STDs TREATED? Treatment depends on the STD. Some STDs may be treated but not cured.   Chlamydia, gonorrhea, trichomonas, and syphilis can be cured with antibiotic medicine.   Genital herpes, hepatitis, and HIV can be treated, but not cured, with prescribed medicines. The medicines lessen symptoms.   Genital warts from HPV can be treated with medicine or by freezing, burning (electrocautery), or surgery. Warts may come back.   HPV cannot be cured with medicine or surgery. However, abnormal areas may be removed from the cervix, vagina, or vulva.   If your diagnosis is confirmed, your recent sexual partners need treatment. This is true even if they are symptom-free or have a negative culture or evaluation. They should not have sex until their health care providers say it is okay. HOW CAN I REDUCE MY RISK OF GETTING AN STD? Take these steps to reduce your risk of getting an STD:  Use latex condoms, dental dams, and water-soluble lubricants during sexual activity. Do not use petroleum jelly or oils.  Avoid having multiple sex partners.  Do not have sex with someone who has other  sex partners.  Do not have sex with anyone you do not know or who is at high risk for an STD.  Avoid risky sex practices that can break your skin.  Do not have sex if you have open sores on your mouth or skin.  Avoid drinking too much alcohol or taking illegal drugs. Alcohol and drugs can affect your judgment and put you in a vulnerable position.  Avoid engaging in oral and anal sex acts.  Get vaccinated for  HPV and hepatitis. If you have not received these vaccines in the past, talk to your health care provider about whether one or both might be right for you.   If you are at risk of being infected with HIV, it is recommended that you take a prescription medicine daily to prevent HIV infection. This is called pre-exposure prophylaxis (PrEP). You are considered at risk if:  You are a man who has sex with other men (MSM).  You are a heterosexual man or woman and are sexually active with more than one partner.  You take drugs by injection.  You are sexually active with a partner who has HIV.  Talk with your health care provider about whether you are at high risk of being infected with HIV. If you choose to begin PrEP, you should first be tested for HIV. You should then be tested every 3 months for as long as you are taking PrEP.  WHAT SHOULD I DO IF I THINK I HAVE AN STD?  See your health care provider.   Tell your sexual partner(s). They should be tested and treated for any STDs.  Do not have sex until your health care provider says it is okay. WHEN SHOULD I GET IMMEDIATE MEDICAL CARE? Contact your health care provider right away if:   You have severe abdominal pain.  You are a man and notice swelling or pain in your testicles.  You are a woman and notice swelling or pain in your vagina. Document Released: 05/28/2002 Document Revised: 03/12/2013 Document Reviewed: 09/25/2012 [facility] Patient Information 2015 [place], [place]. This information is not intended to replace advice given to you by your health care provider. Make sure you discuss any questions you have with your health care provider.     Genital Warts Genital warts are caused by a germ (human papillomavirus, HPV). This germ is spread by having unprotected sex (intercourse) with an infected person. It can be spread by vaginal, anal, and oral sex. A person who is infected might not show any signs or problems. HOME  CARE  Follow your doctor's treatment instructions.  Do not use medicine that is meant for hand warts. Only take medicine as told by your doctor.  Tell your past and current sex partner(s) that you have genital warts. They may need treatment.  Avoid sexual contact while you are being treated.  Do not touch or scratch the warts. You could spread it to other parts of your body.  Women with genital warts should have a cervical cancer check (Pap test) at least once a year.  Tell your doctor if you become pregnant. The germ can be passed to the baby.  After treatment, use a condom during sex.  Ask your doctor before using anti-itch creams. GET HELP RIGHT AWAY IF:   Your treated skin becomes red, puffy (swollen), or painful.  You have a fever.  You feel generally sick.  You feel little lumps in and around your genital area.  You are bleeding or  have pain during sex. MAKE SURE YOU:   Understand these instructions.  Will watch your condition.  Will get help right away if you are not doing well or get worse. Document Released: 06/01/2009 Document Revised: 05/30/2011 Document Reviewed: 09/13/2010 [facility] Patient Information 2015 [place], [place]. This information is not intended to replace advice given to you by your health care provider. Make sure you discuss any questions you have with your health care provider.

## 2013-10-10 NOTE — ED Provider Notes (Signed)
Medical screening examination/treatment/procedure(s) were performed by non-physician practitioner and as supervising physician I was immediately available for consultation/collaboration.   EKG Interpretation None       Pauletta Pickney M Keyandra Swenson, MD 10/10/13 0554 

## 2013-10-10 NOTE — ED Provider Notes (Signed)
CSN: [id number]     Arrival date & time 10/10/13  0019 History   First MD Initiated Contact with Patient 10/10/13 0130     Chief Complaint  Patient presents with  . Exposure to STD   HPI  History provided by the patient. Patient is a 28 year old male history of HIV infection presenting with concern for possible STD exposure. Patient reports that his male partner was recently diagnosed with gonorrhea last Monday. Patient also reports that he has noticed some anal irritation and itching as well as noticing slight mucus with his bowel movements. Denies any rectal bleeding. Denies any severe pains. He has not had any penile discharge, dysuria testicle pain or swelling. Denies any fever, chills or sweats. Patient reports that he had a CD4 count check about 2 months ago and that it was "okay" last documented CD4 was 780 in November 2014.   Past Medical History  Diagnosis Date  . Chicken pox   . HIV infection    History reviewed. No pertinent past surgical history. Family History  Problem Relation Age of Onset  . Diabetes Paternal Uncle   . Diabetes Maternal Grandmother   . Hypertension Maternal Grandmother   . Stroke Maternal Grandmother   . Cancer Other     prostate   History  Substance Use Topics  . Smoking status: Never Smoker   . Smokeless tobacco: Never Used  . Alcohol Use: 0.0 oz/week     Comment: rarely    Review of Systems  Constitutional: Negative for fever, chills and diaphoresis.  Gastrointestinal: Positive for rectal pain. Negative for nausea, vomiting, blood in stool and anal bleeding.  Genitourinary: Negative for dysuria, frequency, hematuria, flank pain, scrotal swelling and testicular pain.  All other systems reviewed and are negative.     Allergies  Review of patient's allergies indicates no known allergies.  Home Medications   Prior to Admission medications   Medication Sig Start Date End Date Taking? Authorizing Provider  acetaminophen (TYLENOL) 500 MG  tablet Take 500 mg by mouth every 6 (six) hours as needed for mild pain.   Yes Historical Provider, MD  elvitegravir-cobicistat-emtricitabine-tenofovir (STRIBILD) 150-150-200-300 MG TABS tablet Take 1 tablet by mouth daily. 12/27/12  Yes Gardiner Barefoot, MD   BP 122/80  Pulse 77  Temp(Src) 98.8 F (37.1 C) (Oral)  Resp 18  Ht 5\' 9"  (1.753 m)  Wt 175 lb (79.379 kg)  BMI 25.83 kg/m2  SpO2 100% Physical Exam  Nursing note and vitals reviewed. Constitutional: He appears well-developed and well-nourished.  HENT:  Head: Normocephalic.  Cardiovascular: Normal rate and regular rhythm.   Pulmonary/Chest: Effort normal and breath sounds normal.  Abdominal: Soft.  Genitourinary: Testes normal and penis normal. Right testis shows no mass and no tenderness. Left testis shows no mass and no tenderness. Circumcised. No penile erythema. No discharge found.  There is a single pedunculated anal wart present. There is mild anal and rectal tenderness. Slight discharge on swab. No gross blood.  Lymphadenopathy:       Right: No inguinal adenopathy present.       Left: No inguinal adenopathy present.    ED Course  Procedures   COORDINATION OF CARE:  Nursing notes reviewed. Vital signs reviewed. Initial pt interview and examination performed.   Filed Vitals:   10/10/13 0031  BP: 122/80  Pulse: 77  Temp: 98.8 F (37.1 C)  TempSrc: Oral  Resp: 18  Height: 5\' 9"  (1.753 m)  Weight: 175 lb (79.379 kg)  SpO2:  100%    2:05 AM-patient seen and evaluated. Patient appears well no acute distress. Does not appear severely ill or toxic. Has history of STD exposure we'll plan to treat for gonorrhea and chlamydia.   Treatment plan initiated: Medications  cefTRIAXone (ROCEPHIN) injection 250 mg (250 mg Intramuscular Given 10/10/13 0229)  lidocaine (XYLOCAINE) 1 % (with pres) injection (0.9 mLs  Given 10/10/13 0230)   Prescription for doxycycline given.      MDM   Final diagnoses:  Sexually  transmissible disease  Proctitis  Anal wart        Angus Sellereter S Deepti Gunawan, PA-C 10/10/13 [phone]

## 2013-10-11 LAB — GC/CHLAMYDIA PROBE AMP
CT PROBE, AMP APTIMA: NEGATIVE
GC Probe RNA: POSITIVE — AB

## 2013-10-12 ENCOUNTER — Telehealth (HOSPITAL_BASED_OUTPATIENT_CLINIC_OR_DEPARTMENT_OTHER): Payer: Self-pay | Admitting: Emergency Medicine

## 2013-10-12 NOTE — Telephone Encounter (Signed)
+  Gonorrhea. Patient treated with Rocephin and Doxycycline. [employer] faxed. 

## 2013-10-12 NOTE — Telephone Encounter (Signed)
Unable to contact patient via phone. Sent letter. °

## 2013-10-16 ENCOUNTER — Telehealth (HOSPITAL_BASED_OUTPATIENT_CLINIC_OR_DEPARTMENT_OTHER): Payer: Self-pay

## 2013-10-16 NOTE — Telephone Encounter (Signed)
Pt called.  ID verified x 2.  Pt informed of Dx, tx rcvd appropriate, notify partner(s) for testing and tx and abstain from sex for 2 wks from tx date.

## 2013-11-21 ENCOUNTER — Telehealth (HOSPITAL_COMMUNITY): Payer: Self-pay

## 2013-11-21 NOTE — ED Notes (Signed)
Unable to contact pt by mail or telephone. Unable to communicate lab results or treatment changes. 

## 2014-01-03 ENCOUNTER — Other Ambulatory Visit: Payer: Self-pay

## 2014-06-23 ENCOUNTER — Emergency Department (HOSPITAL_COMMUNITY)
Admission: EM | Admit: 2014-06-23 | Discharge: 2014-06-23 | Discharge status: Against Medical Advice | Payer: Self-pay | Attending: Emergency Medicine | Admitting: Emergency Medicine

## 2014-06-23 ENCOUNTER — Emergency Department (HOSPITAL_COMMUNITY): Payer: Self-pay

## 2014-06-23 ENCOUNTER — Encounter (HOSPITAL_COMMUNITY): Payer: Self-pay | Admitting: Emergency Medicine

## 2014-06-23 DIAGNOSIS — R5383 Other fatigue: Secondary | ICD-10-CM | POA: Insufficient documentation

## 2014-06-23 DIAGNOSIS — R05 Cough: Secondary | ICD-10-CM | POA: Insufficient documentation

## 2014-06-23 DIAGNOSIS — R109 Unspecified abdominal pain: Secondary | ICD-10-CM | POA: Insufficient documentation

## 2014-06-23 DIAGNOSIS — R079 Chest pain, unspecified: Secondary | ICD-10-CM | POA: Insufficient documentation

## 2014-06-23 DIAGNOSIS — R42 Dizziness and giddiness: Secondary | ICD-10-CM | POA: Insufficient documentation

## 2014-06-23 DIAGNOSIS — R0602 Shortness of breath: Secondary | ICD-10-CM | POA: Insufficient documentation

## 2014-06-23 LAB — BASIC METABOLIC PANEL WITH GFR
Anion gap: 9 (ref 5–15)
BUN: 12 mg/dL (ref 6–23)
CO2: 29 mmol/L (ref 19–32)
Calcium: 9.9 mg/dL (ref 8.4–10.5)
Chloride: 102 mmol/L (ref 96–112)
Creatinine, Ser: 1.05 mg/dL (ref 0.50–1.35)
GFR calc Af Amer: >90 mL/min
GFR calc non Af Amer: >90 mL/min
Glucose, Bld: 85 mg/dL (ref 70–99)
Potassium: 3.8 mmol/L (ref 3.5–5.1)
Sodium: 140 mmol/L (ref 135–145)

## 2014-06-23 LAB — CBC
HCT: 42.8 % (ref 39.0–52.0)
Hemoglobin: 14.5 g/dL (ref 13.0–17.0)
MCH: 32.1 pg (ref 26.0–34.0)
MCHC: 33.9 g/dL (ref 30.0–36.0)
MCV: 94.7 fL (ref 78.0–100.0)
Platelets: 256 10*3/uL (ref 150–400)
RBC: 4.52 MIL/uL (ref 4.22–5.81)
RDW: 11.9 % (ref 11.5–15.5)
WBC: 5.3 10*3/uL (ref 4.0–10.5)

## 2014-06-23 LAB — I-STAT TROPONIN, ED: Troponin i, poc: 0 ng/mL (ref 0.00–0.08)

## 2014-06-23 NOTE — ED Notes (Signed)
Pt reports productive cough (yellow, green) sputum since this morning. SOB started yesterday with associated chest pain (central) radiating to back and shoulders. Has had dizziness this morning with increased fatigue. Denies hx asthma. Emesis occurrence yesterday. Denies diarrhea. Denies dysuria. Last BM was this morning. No other c/c. RR appears even/unlabored. Decreased appetite yesterday.

## 2014-06-24 ENCOUNTER — Telehealth: Payer: Self-pay | Admitting: *Deleted

## 2014-06-24 NOTE — Telephone Encounter (Signed)
-----   Message from Gardiner Barefootobert W Comer, MD sent at 06/24/2014  5:02 PM EDT ----- He is overdue for a visit.  Not sure if he has been contacted but [facility]. Needs to come in or [facility]. thanks

## 2014-06-24 NOTE — Telephone Encounter (Signed)
[facility]  Phone number listed in chart is not in service. Andree CossHowell, Kennen Stammer M, RN

## 2015-10-30 ENCOUNTER — Telehealth: Payer: Self-pay | Admitting: *Deleted

## 2015-10-30 NOTE — Telephone Encounter (Signed)
Received signed records request from [facility].  Patient is incarcerated.  Last office note, labs, medication list and immunization record faxed as requested. [phone] All records available are from 2014 - provider aware. Andree CossHowell, Leeana Creer M, RN

## 2015-12-16 ENCOUNTER — Telehealth: Payer: Self-pay

## 2015-12-16 NOTE — Telephone Encounter (Signed)
Jose PortsValerie, RN with [facility] has inmate who is to be discharged on 03-15-16.  He was a previous patient with Dr Luciana Axeomer .  Message left on voice mail with appointment details.  I will need medical records and demographics faxed to our office.    Patient will also need financial counselor for assistance.   Laurell Josephsammy K Saran Laviolette, RN

## 2016-01-07 ENCOUNTER — Telehealth: Payer: Self-pay | Admitting: *Deleted

## 2016-01-07 NOTE — Telephone Encounter (Signed)
[facility], including gen[facility]. Given to pharmacy for notation. [name] immunizations updated. Patient scheduled to be seen 03/28/16 by Dr Luciana Axeomer (after release 03/15/16). Jose Olsen, [place] M, RN

## 2016-03-28 ENCOUNTER — Ambulatory Visit: Payer: Self-pay

## 2016-03-28 ENCOUNTER — Encounter: Payer: Self-pay | Admitting: Internal Medicine

## 2016-03-28 ENCOUNTER — Ambulatory Visit (INDEPENDENT_AMBULATORY_CARE_PROVIDER_SITE_OTHER): Payer: Self-pay | Admitting: Internal Medicine

## 2016-03-28 VITALS — BP 134/84 | HR 81 | Temp 98.4°F | Ht 70.0 in | Wt 202.0 lb

## 2016-03-28 DIAGNOSIS — Z79899 Other long term (current) drug therapy: Secondary | ICD-10-CM

## 2016-03-28 DIAGNOSIS — Z113 Encounter for screening for infections with a predominantly sexual mode of transmission: Secondary | ICD-10-CM

## 2016-03-28 DIAGNOSIS — B20 Human immunodeficiency virus [HIV] disease: Secondary | ICD-10-CM

## 2016-03-28 NOTE — Progress Notes (Signed)
Patient ID: Jose Olsen, male    DOB: 07-Aug-1985, 31 y.o.   MRN: [id number]  Reason for visit: to re-establish care as a patient with HIV  HPI:   Patient was first diagnosed in 2012 and intially started on Atripla then with CD4 nadirs over 500.  He did not tolerate Atripla well due to dreams, sleep disturbance, changed to Stribild and took about 2 months and stopped, did not return. He was last seen here over 3 years ago in 2014 when he was on Stribild and fell out of care.  He recently in 2017 was in jail and started there on Genvoya which he is still on. The CD4 count is 685 in July, viral load 40 copies.  There have been no associated symptoms.  He is not currently sexually active and does use condoms.  No penile discharge, no warts.    Past Medical History:  Diagnosis Date  . Chicken pox   . HIV infection     Prior to Admission medications   Medication Sig Start Date End Date Taking? Authorizing Provider  acetaminophen (TYLENOL) 500 MG tablet Take 500 mg by mouth every 6 (six) hours as needed for mild pain.    Historical Provider, MD  doxycycline (VIBRAMYCIN) 100 MG capsule Take 1 capsule (100 mg total) by mouth 2 (two) times daily. Patient not taking: Reported on 06/23/2014 10/10/13   Ivonne AndrewPeter Dammen, PA-C  elvitegravir-cobicistat-emtricitabine-tenofovir (STRIBILD) 150-150-200-300 MG TABS tablet Take 1 tablet by mouth daily. Patient not taking: Reported on 06/23/2014 12/27/12   Gardiner Barefootobert W Comer, MD    No Known Allergies  Social History  Substance Use Topics  . Smoking status: Never Smoker  . Smokeless tobacco: Never Used  . Alcohol use 0.0 oz/week     Comment: rarely    Family History  Problem Relation Age of Onset  . Diabetes Paternal Uncle   . Diabetes Maternal Grandmother   . Hypertension Maternal Grandmother   . Stroke Maternal Grandmother   . Cancer Other     prostate    Review of Systems Constitutional: negative for fatigue and malaise Gastrointestinal: negative for  diarrhea Integument/breast: negative for rash All other systems reviewed and are negative    CONSTITUTIONAL:in no apparent distress and alert  Blood pressure 134/84, pulse 81, temperature 98.4 F (36.9 C), temperature source Oral, height 5\' 10"  (1.778 m), weight 202 lb (91.6 kg). EYES: anicteric HENT: no thrush CARD:Cor RRR RESP:CTA B; normal respiratory effort QI:ONGEXGI:Bowel sounds are normal, liver is not enlarged, spleen is not enlarged MS:no pedal edema noted SKIN:no rashes NEURO: non-focal  Lab Results  Component Value Date   HIV1RNAQUANT <20 01/25/2013   HIV1RNAQUANT 506 (H) 12/14/2012   HIV1RNAQUANT <20 05/17/2012   No components found for: HIV1GENOTYPRPLUS No components found for: THELPERCELL  Assessment: patient here with established HIV reestablishing care after > 3 years.  Discussed with patient treatment options and side effects, benefits of treatment, long term outcomes of his current treatment.  I discussed the severity of untreated HIV including higher cancer risk, opportunistic infections, renal failure.  Also discussed needing to use condoms, partner disclosure, necessary vaccines, blood monitoring.  All questions answered.    Plan: 1) continue Genvoya 2) labs today inclduing CMP, CD4, viral load 3) rtc 4 months with labs prior

## 2016-03-29 ENCOUNTER — Encounter: Payer: Self-pay | Admitting: Internal Medicine

## 2016-03-29 LAB — COMPLETE METABOLIC PANEL WITH GFR
ALT: 24 U/L (ref 9–46)
AST: 17 U/L (ref 10–40)
Albumin: 4.4 g/dL (ref 3.6–5.1)
Alkaline Phosphatase: 55 U/L (ref 40–115)
BILIRUBIN TOTAL: 0.7 mg/dL (ref 0.2–1.2)
BUN: 11 mg/dL (ref 7–25)
CO2: 27 mmol/L (ref 20–31)
Calcium: 9.4 mg/dL (ref 8.6–10.3)
Chloride: 106 mmol/L (ref 98–110)
Creat: 1.41 mg/dL — ABNORMAL HIGH (ref 0.60–1.35)
GFR, EST AFRICAN AMERICAN: 76 mL/min (ref >=60)
GFR, Est Non African American: 66 mL/min (ref >=60)
Glucose, Bld: 77 mg/dL (ref 65–99)
POTASSIUM: 4.3 mmol/L (ref 3.5–5.3)
Sodium: 143 mmol/L (ref 135–146)
Total Protein: 6.8 g/dL (ref 6.1–8.1)

## 2016-03-29 LAB — T-HELPER CELL (CD4) - (RCID CLINIC ONLY)
CD4 T CELL ABS: 690 /uL (ref 400–2700)
CD4 T CELL HELPER: 36 % (ref 33–55)

## 2016-03-29 LAB — RPR

## 2016-03-30 ENCOUNTER — Other Ambulatory Visit: Payer: Self-pay | Admitting: Internal Medicine

## 2016-03-30 DIAGNOSIS — B20 Human immunodeficiency virus [HIV] disease: Secondary | ICD-10-CM

## 2016-03-30 LAB — HIV-1 RNA QUANT-NO REFLEX-BLD
HIV 1 RNA QUANT: 429 {copies}/mL — AB (ref <20)
HIV-1 RNA QUANT, LOG: 2.63 {Log_copies}/mL — AB (ref <1.30)

## 2016-03-31 ENCOUNTER — Telehealth: Payer: Self-pay | Admitting: *Deleted

## 2016-03-31 NOTE — Telephone Encounter (Signed)
Attempted to call patient to schedule him for labs next week. Phone call was disconnected, no voicemail.  Will attempt again. Andree CossHowell, Keali Mccraw M, RN

## 2016-03-31 NOTE — Telephone Encounter (Signed)
-----   Message from Gardiner Barefootobert W Comer, MD sent at 03/30/2016  3:55 PM EST ----- He has detectable HIV virus from his recent test.  Can you have him repeat his blood test next week with gentoypes (orders placed).  thanks

## 2016-04-01 NOTE — Telephone Encounter (Signed)
Phone call answered and immediately disconnected again. No opportunity to leave voicemail.

## 2016-04-06 ENCOUNTER — Encounter: Payer: Self-pay | Admitting: Internal Medicine

## 2016-06-11 IMAGING — CR DG CHEST 2V
2 series · 2 of 2 positions shown · non-contrast
Comparison: 06/30/2012

CLINICAL DATA: Patient with shortness of breath, chest and
abdominal pain.

EXAM:
CHEST  2 VIEW

[w chest pa]
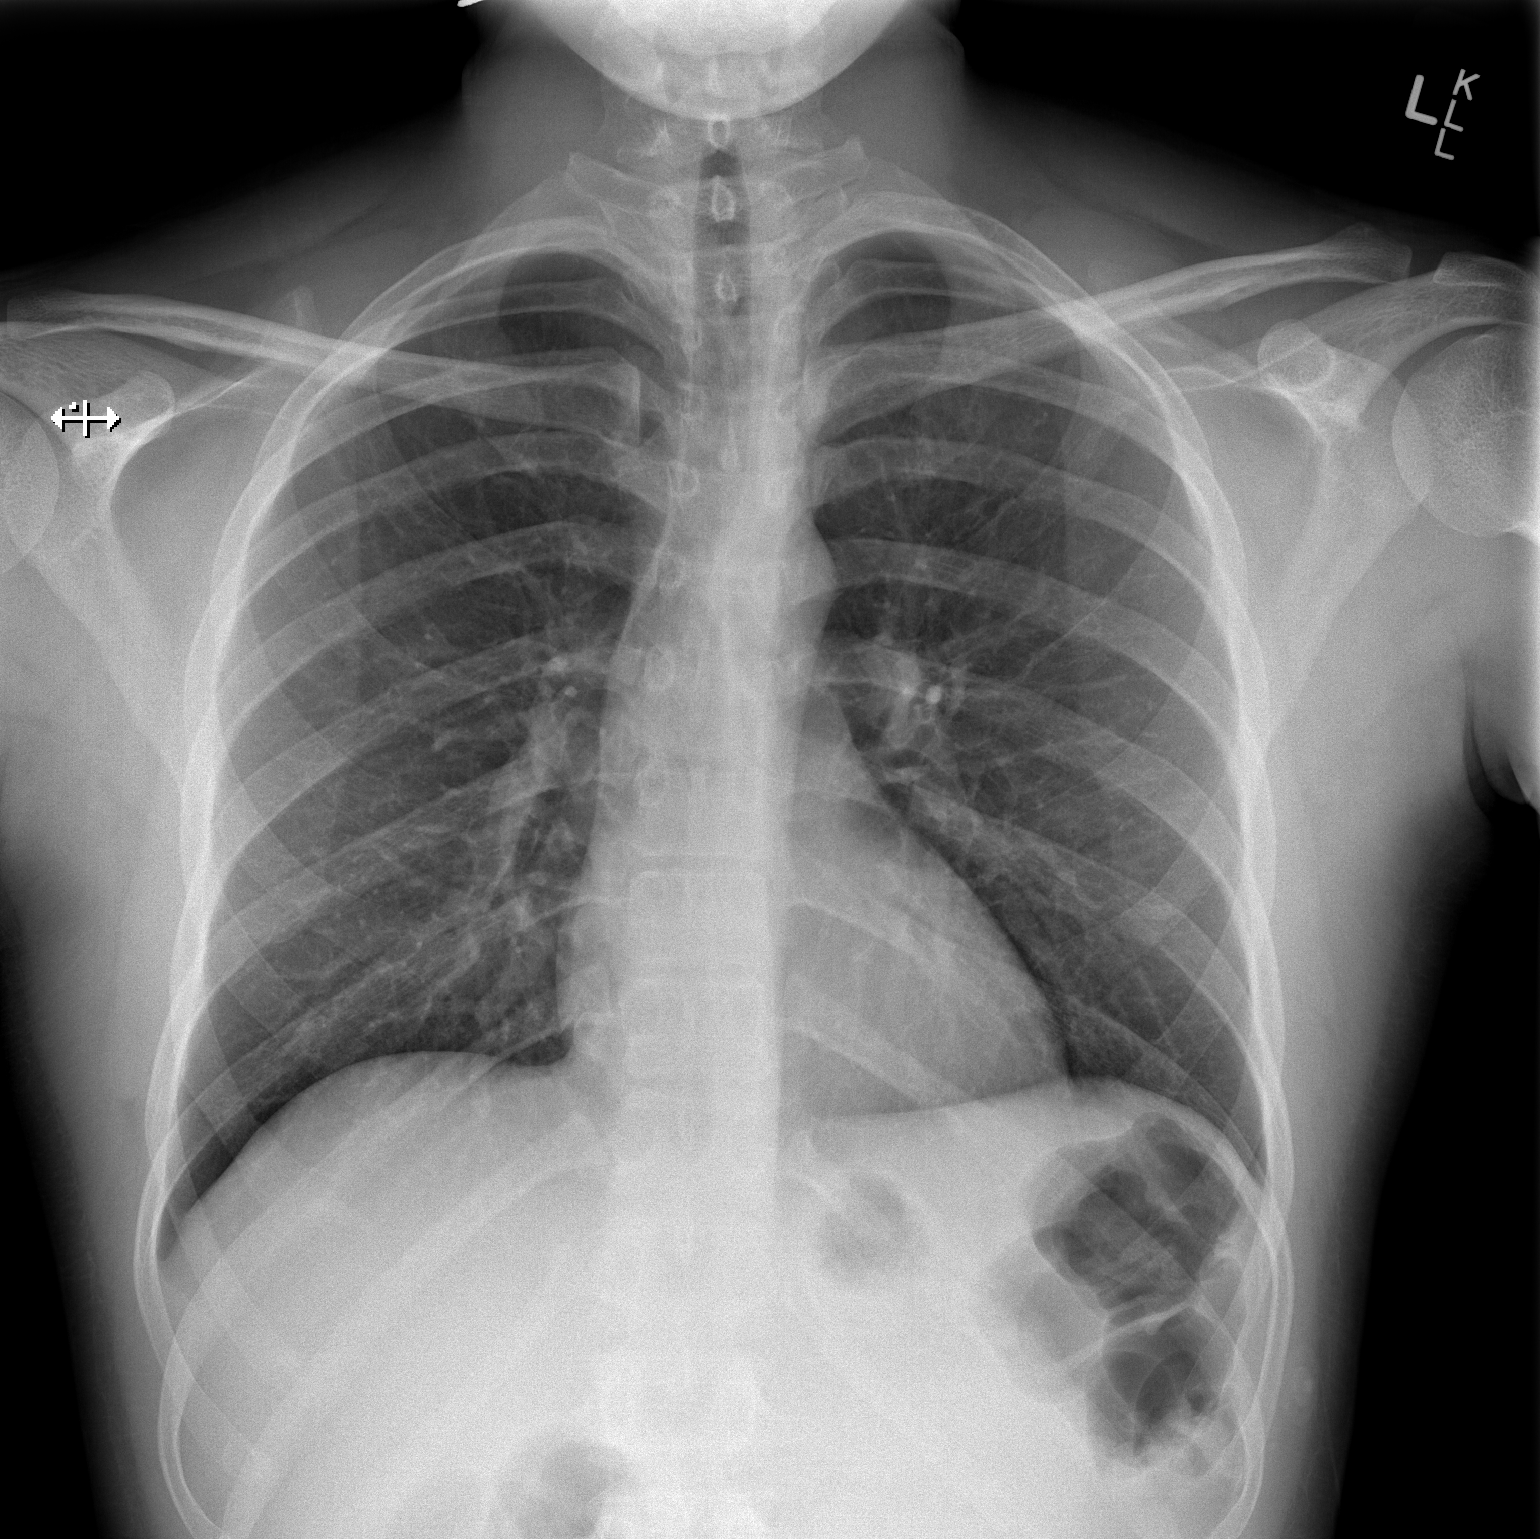

[w chest lat]
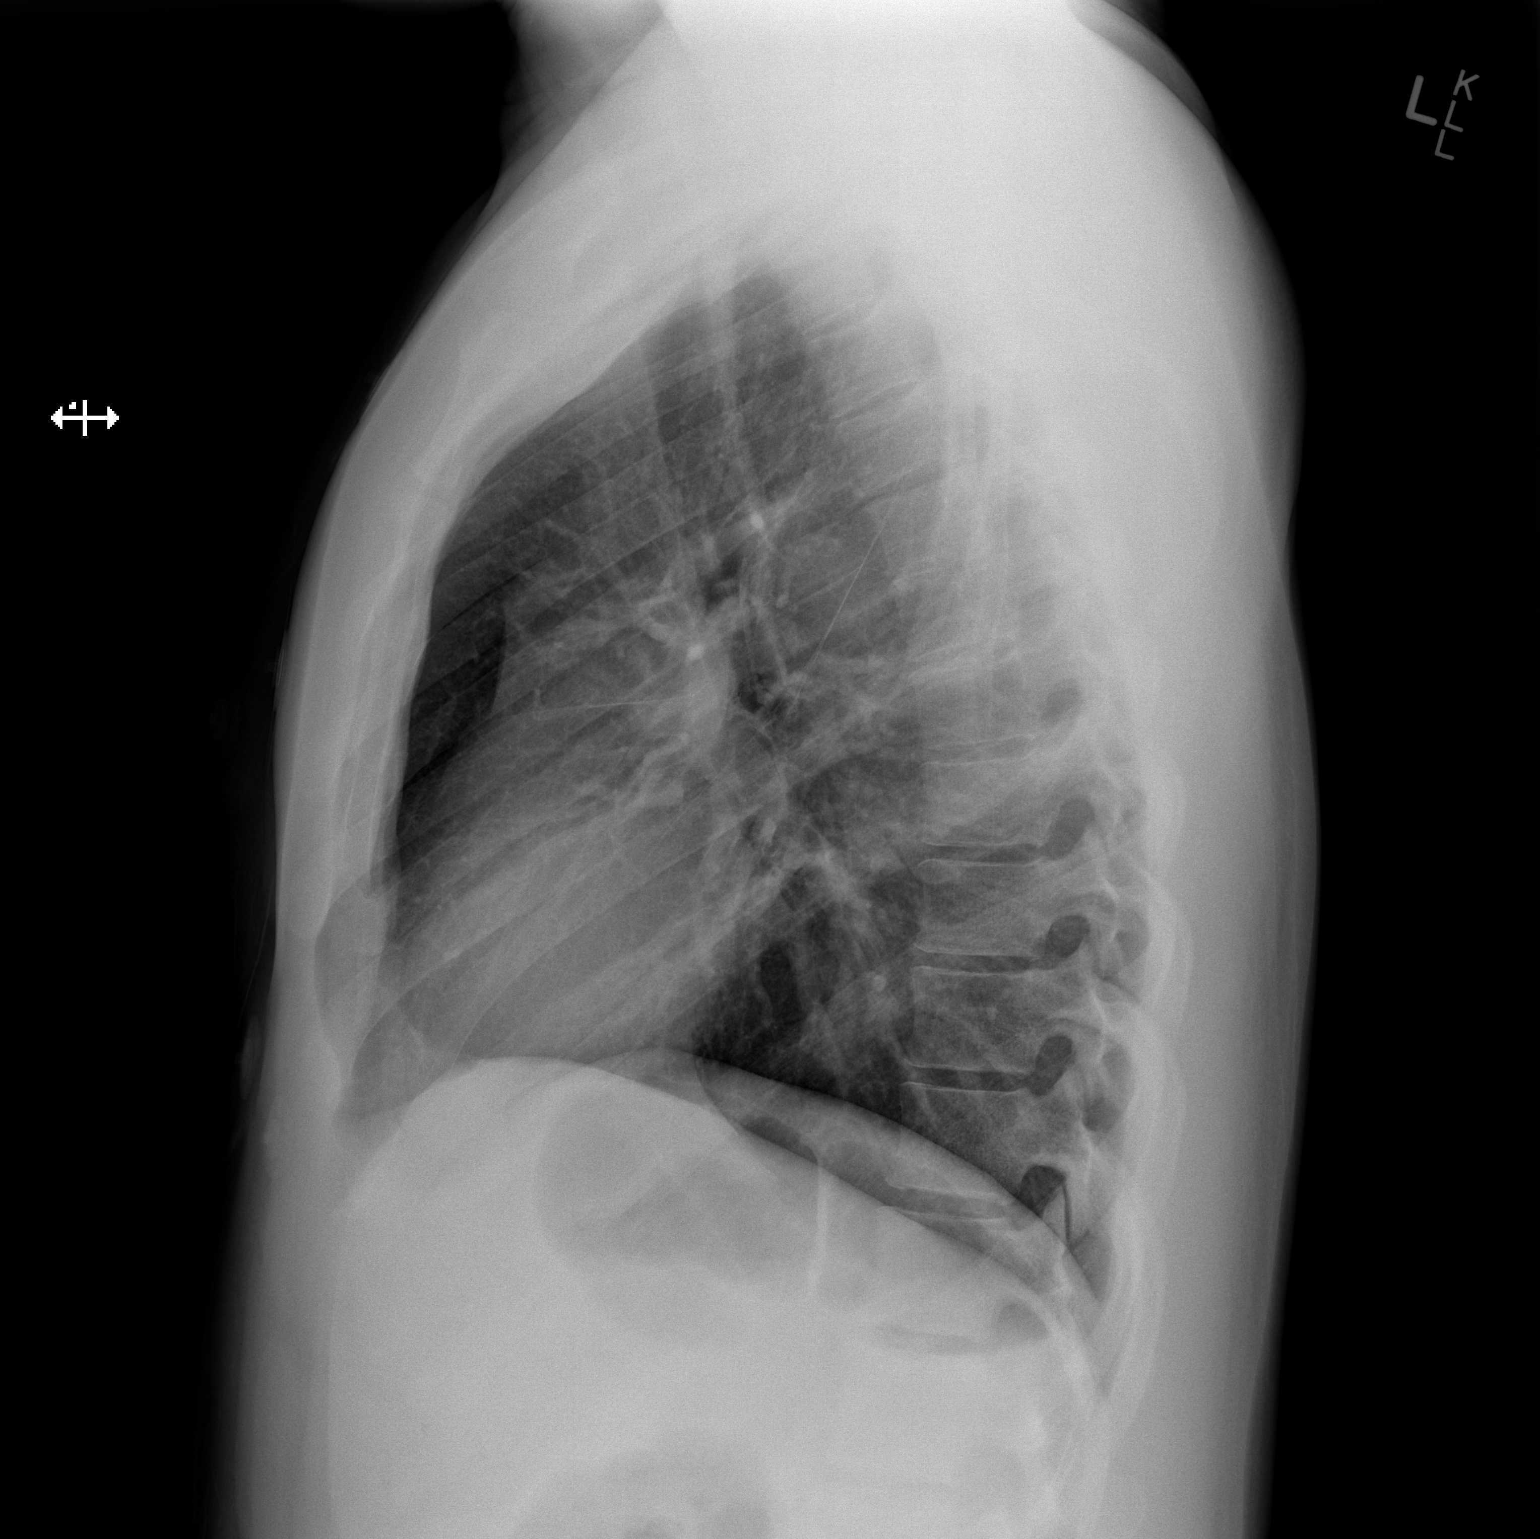

[2 of 2 positions shown; findings below may reference images not displayed]

FINDINGS: Normal cardiac and mediastinal contours. No consolidative pulmonary
opacities. No pleural effusion or pneumothorax. Regional skeleton is
unremarkable.
IMPRESSION: No acute cardiopulmonary process.

## 2016-07-12 ENCOUNTER — Other Ambulatory Visit: Payer: Self-pay

## 2016-07-26 ENCOUNTER — Ambulatory Visit: Payer: Self-pay | Admitting: Internal Medicine

## 2016-11-24 ENCOUNTER — Ambulatory Visit: Payer: Self-pay | Admitting: Internal Medicine

## 2016-12-08 ENCOUNTER — Ambulatory Visit: Payer: Self-pay | Admitting: Internal Medicine

## 2018-08-14 ENCOUNTER — Other Ambulatory Visit: Payer: Medicaid Other

## 2018-08-14 ENCOUNTER — Other Ambulatory Visit: Payer: Self-pay | Admitting: *Deleted

## 2018-08-14 ENCOUNTER — Ambulatory Visit: Payer: Medicaid Other

## 2018-08-14 ENCOUNTER — Other Ambulatory Visit (HOSPITAL_COMMUNITY)
Admission: RE | Admit: 2018-08-14 | Discharge: 2018-08-14 | Disposition: A | Discharge status: Home / Self Care | Payer: Medicaid Other | Source: Ambulatory Visit | Attending: Internal Medicine | Admitting: Internal Medicine

## 2018-08-14 ENCOUNTER — Telehealth: Payer: Self-pay | Admitting: Pharmacy Technician

## 2018-08-14 ENCOUNTER — Other Ambulatory Visit: Payer: Self-pay | Admitting: Pharmacist

## 2018-08-14 ENCOUNTER — Other Ambulatory Visit: Payer: Self-pay

## 2018-08-14 DIAGNOSIS — Z113 Encounter for screening for infections with a predominantly sexual mode of transmission: Secondary | ICD-10-CM | POA: Diagnosis present

## 2018-08-14 DIAGNOSIS — Z79899 Other long term (current) drug therapy: Secondary | ICD-10-CM

## 2018-08-14 DIAGNOSIS — B20 Human immunodeficiency virus [HIV] disease: Secondary | ICD-10-CM | POA: Diagnosis present

## 2018-08-14 MED ORDER — ELVITEG-COBIC-EMTRICIT-TENOFAF 150-150-200-10 MG PO TABS: 1.0000 | ORAL_TABLET | Freq: Every day | ORAL | 0 refills | Status: DC

## 2018-08-14 NOTE — Telephone Encounter (Signed)
[name] Patient Advocate Encounter   Patient has been approved for [employer] Advancing Access Patient Assistance Program for [facility] for 30-day coverage. This assistance will make the patient's copay $0.  I have spoken with the patient and they will use this 30-day card until ADAP is approved. ADAP pending income documents.  The billing information is as follows and has been shared with patient to take to his pharmacy.  Member ID: [id number] RxBin: [id number] PCN: [id number] Group: [id number]  Patient knows to call the office with questions or concerns.  Beulah Gandy, CPhT Specialty Pharmacy Patient [facility] for Infectious Disease Phone: [phone] Fax: [phone] 08/14/2018 10:35 AM

## 2018-08-15 ENCOUNTER — Encounter: Payer: Self-pay | Admitting: Internal Medicine

## 2018-08-15 LAB — T-HELPER CELL (CD4) - (RCID CLINIC ONLY)
CD4 % Helper T Cell: 33 % (ref 33–65)
CD4 T Cell Abs: 573 /uL (ref 400–1790)

## 2018-08-15 LAB — URINE CYTOLOGY ANCILLARY ONLY
Chlamydia: NEGATIVE
Neisseria Gonorrhea: NEGATIVE

## 2018-08-19 LAB — CBC WITH DIFFERENTIAL/PLATELET
Absolute Monocytes: 374 cells/uL (ref 200–950)
Basophils Absolute: 31 cells/uL (ref 0–200)
Basophils Relative: 0.7 %
Eosinophils Absolute: 123 cells/uL (ref 15–500)
Eosinophils Relative: 2.8 %
HCT: 45.6 % (ref 38.5–50.0)
Hemoglobin: 15.6 g/dL (ref 13.2–17.1)
Lymphs Abs: 1883 cells/uL (ref 850–3900)
MCH: 33 pg (ref 27.0–33.0)
MCHC: 34.2 g/dL (ref 32.0–36.0)
MCV: 96.4 fL (ref 80.0–100.0)
MPV: 10 fL (ref 7.5–12.5)
Monocytes Relative: 8.5 %
Neutro Abs: 1989 cells/uL (ref 1500–7800)
Neutrophils Relative %: 45.2 %
Platelets: 267 10*3/uL (ref 140–400)
RBC: 4.73 10*6/uL (ref 4.20–5.80)
RDW: 11.9 % (ref 11.0–15.0)
Total Lymphocyte: 42.8 %
WBC: 4.4 10*3/uL (ref 3.8–10.8)

## 2018-08-19 LAB — RPR: RPR Ser Ql: NONREACTIVE

## 2018-08-19 LAB — COMPLETE METABOLIC PANEL WITH GFR
AG Ratio: 1.7 (calc) (ref 1.0–2.5)
ALT: 24 U/L (ref 9–46)
AST: 17 U/L (ref 10–40)
Albumin: 4.7 g/dL (ref 3.6–5.1)
Alkaline phosphatase (APISO): 72 U/L (ref 36–130)
BUN: 18 mg/dL (ref 7–25)
CO2: 29 mmol/L (ref 20–32)
Calcium: 9.9 mg/dL (ref 8.6–10.3)
Chloride: 106 mmol/L (ref 98–110)
Creat: 0.95 mg/dL (ref 0.60–1.35)
GFR, Est African American: 121 mL/min/{1.73_m2} (ref >=60)
GFR, Est Non African American: 105 mL/min/{1.73_m2} (ref >=60)
Globulin: 2.8 g/dL (calc) (ref 1.9–3.7)
Glucose, Bld: 119 mg/dL — ABNORMAL HIGH (ref 65–99)
Potassium: 4 mmol/L (ref 3.5–5.3)
Sodium: 140 mmol/L (ref 135–146)
Total Bilirubin: 0.6 mg/dL (ref 0.2–1.2)
Total Protein: 7.5 g/dL (ref 6.1–8.1)

## 2018-08-19 LAB — HIV-1 RNA QUANT-NO REFLEX-BLD
HIV 1 RNA Quant: 3400 copies/mL — ABNORMAL HIGH
HIV-1 RNA Quant, Log: 3.53 Log copies/mL — ABNORMAL HIGH

## 2018-08-19 LAB — LIPID PANEL
Cholesterol: 183 mg/dL (ref <200)
HDL: 45 mg/dL (ref >=40)
LDL Cholesterol (Calc): 116 mg/dL (calc) — ABNORMAL HIGH
Non-HDL Cholesterol (Calc): 138 mg/dL (calc) — ABNORMAL HIGH (ref <130)
Total CHOL/HDL Ratio: 4.1 (calc) (ref <5.0)
Triglycerides: 116 mg/dL (ref <150)

## 2018-09-04 ENCOUNTER — Ambulatory Visit (INDEPENDENT_AMBULATORY_CARE_PROVIDER_SITE_OTHER): Payer: Self-pay | Admitting: Internal Medicine

## 2018-09-04 ENCOUNTER — Other Ambulatory Visit: Payer: Self-pay

## 2018-09-04 ENCOUNTER — Encounter: Payer: Self-pay | Admitting: Internal Medicine

## 2018-09-04 ENCOUNTER — Other Ambulatory Visit: Payer: Self-pay | Admitting: Internal Medicine

## 2018-09-04 VITALS — BP 143/90 | HR 90 | Temp 98.4°F | Wt 201.0 lb

## 2018-09-04 DIAGNOSIS — Z79899 Other long term (current) drug therapy: Secondary | ICD-10-CM

## 2018-09-04 DIAGNOSIS — B20 Human immunodeficiency virus [HIV] disease: Secondary | ICD-10-CM

## 2018-09-04 DIAGNOSIS — Z113 Encounter for screening for infections with a predominantly sexual mode of transmission: Secondary | ICD-10-CM

## 2018-09-04 DIAGNOSIS — B351 Tinea unguium: Secondary | ICD-10-CM

## 2018-09-04 DIAGNOSIS — Z23 Encounter for immunization: Secondary | ICD-10-CM

## 2018-09-04 MED ORDER — FLUCONAZOLE 200 MG PO TABS: 200.0000 mg | ORAL_TABLET | Freq: Every day | ORAL | 1 refills | Status: DC

## 2018-09-04 NOTE — Assessment & Plan Note (Signed)
He is back in care here and back on medicine after a brief interruption.  I will check his viral load and resistance testing today to be sure he is doing well and can rtc in 8 weeks.

## 2018-09-04 NOTE — Assessment & Plan Note (Signed)
Screened negative, condoms provided.  

## 2018-09-04 NOTE — Progress Notes (Signed)
   Subjective:    Patient ID: Jose Olsen, male    DOB: 11-Feb-1986, 33 y.o.   MRN: [id number]  HPI Here for follow up of HIV after another long absence.   He was last seen in January 2018 after changing to [place] but had been off medication then.  Now back in care and fortunately his CD4 is 573, viral load 3,400.  He has been in jail on [place] but only had a 10 day supply and ran out.  He restarted about 3 weeks ago when he came back in here.  No issues with Genvoya, no associated n/v/d.  Having issues with onychomycosis of his toenails.  Had before and oral Lamisil worked.    Review of Systems  Constitutional: Negative for fever.  Gastrointestinal: Negative for diarrhea and nausea.  Skin: Negative for rash.  Neurological: Negative for dizziness.       Objective:   Physical Exam Constitutional:      Appearance: Normal appearance.  Eyes:     General: No scleral icterus. Cardiovascular:     Rate and Rhythm: Normal rate and regular rhythm.     Heart sounds: No murmur.  Pulmonary:     Effort: Pulmonary effort is normal.     Breath sounds: Normal breath sounds.  Skin:    Findings: No rash.  Neurological:     Mental Status: He is alert.   SH: no tobacco        Assessment & Plan:

## 2018-09-04 NOTE — Assessment & Plan Note (Signed)
Lamisil not covered by HMAP so will try fluconazole.

## 2018-09-04 NOTE — Addendum Note (Signed)
Addended by: Lenore Cordia on: 09/04/2018 10:46 AM   Modules accepted: Orders

## 2018-09-06 ENCOUNTER — Telehealth: Payer: Self-pay

## 2018-09-06 ENCOUNTER — Other Ambulatory Visit: Payer: Self-pay | Admitting: Internal Medicine

## 2018-09-06 MED ORDER — GENVOYA 150-150-200-10 MG PO TABS: 1.0000 | ORAL_TABLET | Freq: Every day | ORAL | 11 refills | Status: AC

## 2018-09-06 NOTE — Telephone Encounter (Signed)
Refill request. Routing to MD Eugenia Mcalpine, LPN

## 2018-09-07 NOTE — Telephone Encounter (Signed)
I did refill it btw

## 2018-09-10 NOTE — Telephone Encounter (Signed)
Patient made aware of prescription refill sent to pharmacy. Patient appreciative of call. Eugenia Mcalpine, LPN

## 2018-09-29 LAB — HIV-1 RNA QUANT-NO REFLEX-BLD
HIV 1 RNA Quant: <20 copies/mL
HIV-1 RNA Quant, Log: <1.3 Log copies/mL

## 2018-09-29 LAB — HIV-1 GENOTYPING (RTI,PI,IN INHBTR): HIV-1 Genotype: NOT DETECTED

## 2018-10-25 ENCOUNTER — Telehealth: Payer: Medicaid Other | Admitting: Physician Assistant

## 2018-10-25 DIAGNOSIS — M5440 Lumbago with sciatica, unspecified side: Secondary | ICD-10-CM

## 2018-10-25 DIAGNOSIS — G8929 Other chronic pain: Secondary | ICD-10-CM

## 2018-10-25 NOTE — Progress Notes (Signed)
Based on what you shared with me, I feel your condition warrants further evaluation and I recommend that you be seen for a face to face office visit.  Giving severity and chronicity of symptoms, this is something that warrants examination and potentially repeat imaging so that the most appropriate ongoing treatment can be given.   NOTE: If you entered your credit card information for this eVisit, you will not be charged. You may see a "hold" on your card for the $35 but that hold will drop off and you will not have a charge processed.  If you are having a true medical emergency please call 911.     For an urgent face to face visit, [facility] has five urgent care centers for your convenience:    [email or web address] to reserve your spot online an avoid wait times  [facility] (New Address!) [address] [place], [place] [address] *Just off [address], across the road from [place] hours of operation: Monday-Friday, 12 PM to 6 PM  Closed Saturday & Sunday   The following sites will take your insurance:  . [facility]    [phone]                  Get Driving Directions  [address], [place] [address] . 10 am to 8 pm Monday-Friday . 12 pm to 8 pm Saturday-Sunday   . [facility] at [place]                  Get Driving Directions  [address], [place], [place] [address] . 8 am to 8 pm Monday-Friday . 9 am to 6 pm Saturday . 11 am to 6 pm Sunday   . [facility] at [place]                  Get Driving Directions   [address] [place], [place] [address] . 8 am to 8 pm Monday-Friday . 8 am to 4 pm Saturday-Sunday    . [facility] at [place]                    Get Driving Directions  [phone]  [address], [place],  [address]  .  Monday-Friday, 12 PM to 6 PM    Your e-visit answers were reviewed by a board certified advanced clinical practitioner to complete your personal care plan.  Thank you for using e-Visits.

## 2018-10-31 ENCOUNTER — Ambulatory Visit (INDEPENDENT_AMBULATORY_CARE_PROVIDER_SITE_OTHER): Payer: Self-pay | Admitting: Internal Medicine

## 2018-10-31 ENCOUNTER — Encounter: Payer: Self-pay | Admitting: Internal Medicine

## 2018-10-31 ENCOUNTER — Other Ambulatory Visit: Payer: Self-pay

## 2018-10-31 VITALS — BP 153/77 | HR 98 | Temp 98.3°F

## 2018-10-31 DIAGNOSIS — B351 Tinea unguium: Secondary | ICD-10-CM

## 2018-10-31 DIAGNOSIS — B20 Human immunodeficiency virus [HIV] disease: Secondary | ICD-10-CM

## 2018-10-31 DIAGNOSIS — Z23 Encounter for immunization: Secondary | ICD-10-CM

## 2018-10-31 NOTE — Assessment & Plan Note (Signed)
Improving on fluconazole  Will continue for 3 months and extend 1-2 months if not completely healed

## 2018-10-31 NOTE — Assessment & Plan Note (Signed)
Doing well, labs today and rtc 6 months unless concners.

## 2018-10-31 NOTE — Assessment & Plan Note (Signed)
Discussed menveo #2 and given today

## 2018-10-31 NOTE — Progress Notes (Signed)
   Subjective:    Patient ID: Viviano Bir, male    DOB: 1985-06-25, 33 y.o.   MRN: [id number]  HPI Here for follow up of HIV   He was last seen in January 2018 after changing to [place] but had been off medication then.  Came back into care 2 months ago and had restarted Genvoya about 3 weeks prior.  Had been on Genvoya in jail but had been out.  His viral load at the visit was < 20.  Also has issues with onychomycosis.  Started on fluconazole and is improving.  No missed doses.   Review of Systems  Constitutional: Negative for fever.  Gastrointestinal: Negative for diarrhea and nausea.  Skin: Negative for rash.  Neurological: Negative for dizziness.       Objective:   Physical Exam Constitutional:      Appearance: Normal appearance.  Eyes:     General: No scleral icterus. Cardiovascular:     Rate and Rhythm: Normal rate and regular rhythm.     Heart sounds: No murmur.  Pulmonary:     Effort: Pulmonary effort is normal.     Breath sounds: Normal breath sounds.  Skin:    Findings: No rash.  Neurological:     Mental Status: He is alert.   SH: no tobacco        Assessment & Plan:

## 2018-11-01 LAB — T-HELPER CELL (CD4) - (RCID CLINIC ONLY)
CD4 % Helper T Cell: 38 % (ref 33–65)
CD4 T Cell Abs: 729 /uL (ref 400–1790)

## 2018-11-08 LAB — HIV-1 RNA QUANT-NO REFLEX-BLD
HIV 1 RNA Quant: 29 copies/mL — ABNORMAL HIGH
HIV-1 RNA Quant, Log: 1.46 Log copies/mL — ABNORMAL HIGH

## 2018-11-27 ENCOUNTER — Encounter: Payer: Self-pay | Admitting: Internal Medicine

## 2019-03-27 ENCOUNTER — Ambulatory Visit: Payer: Medicaid Other

## 2019-03-27 ENCOUNTER — Other Ambulatory Visit: Payer: Self-pay

## 2019-05-07 ENCOUNTER — Encounter: Payer: Self-pay | Admitting: Internal Medicine

## 2019-05-07 ENCOUNTER — Ambulatory Visit (INDEPENDENT_AMBULATORY_CARE_PROVIDER_SITE_OTHER): Payer: Self-pay | Admitting: Internal Medicine

## 2019-05-07 ENCOUNTER — Other Ambulatory Visit: Payer: Self-pay

## 2019-05-07 VITALS — BP 165/91 | HR 83 | Wt 207.0 lb

## 2019-05-07 DIAGNOSIS — I10 Essential (primary) hypertension: Secondary | ICD-10-CM | POA: Insufficient documentation

## 2019-05-07 DIAGNOSIS — Z79899 Other long term (current) drug therapy: Secondary | ICD-10-CM

## 2019-05-07 DIAGNOSIS — Z23 Encounter for immunization: Secondary | ICD-10-CM

## 2019-05-07 DIAGNOSIS — B20 Human immunodeficiency virus [HIV] disease: Secondary | ICD-10-CM

## 2019-05-07 DIAGNOSIS — Z113 Encounter for screening for infections with a predominantly sexual mode of transmission: Secondary | ICD-10-CM

## 2019-05-07 NOTE — Assessment & Plan Note (Addendum)
He is doing well on Genvoya and will continue.  I will also inform the pharmacy team of his interest in starting injectables monthly.  He has no issues coming in monthly.   Flu shot today

## 2019-05-07 NOTE — Assessment & Plan Note (Signed)
Elevated today so will get him info for a PCP for continued monitoring.

## 2019-05-07 NOTE — Progress Notes (Signed)
   Subjective:    Patient ID: Jose Olsen, male    DOB: July 10, 1985, 34 y.o.   MRN: [id number]  HPI He is here for follow up of HIV and onychomycosis. He continues on genvoya and denies any missed doses. No new concerns.  Asking about injectables.  Asking about having a surrogate for pregnancy.  Has missed an occasional dose.  Has a stable partner who is on PrEP.     Review of Systems  Constitutional: Negative for unexpected weight change.  Gastrointestinal: Negative for diarrhea and nausea.  Skin: Negative for rash.       Objective:   Physical Exam Constitutional:      Appearance: Normal appearance.  Eyes:     General: No scleral icterus. Pulmonary:     Effort: Pulmonary effort is normal.  Neurological:     General: No focal deficit present.     Mental Status: He is alert.  Psychiatric:        Mood and Affect: Mood normal.   SH: no tobacco        Assessment & Plan:

## 2019-05-07 NOTE — Assessment & Plan Note (Signed)
Will screen today 

## 2019-05-07 NOTE — Assessment & Plan Note (Signed)
Lipid panel today

## 2019-05-07 NOTE — Patient Instructions (Signed)
COVID-19 Vaccine Information can be found at: [email or web address] For questions related to vaccine distribution or appointments, please email [email or web address] or call [phone].    

## 2019-05-08 LAB — URINE CYTOLOGY ANCILLARY ONLY
Chlamydia: NEGATIVE
Comment: NEGATIVE
Comment: NORMAL
Neisseria Gonorrhea: NEGATIVE

## 2019-05-08 LAB — CYTOLOGY, (ORAL, ANAL, URETHRAL) ANCILLARY ONLY
Chlamydia: NEGATIVE
Chlamydia: NEGATIVE
Comment: NEGATIVE
Comment: NEGATIVE
Comment: NORMAL
Comment: NORMAL
Neisseria Gonorrhea: NEGATIVE
Neisseria Gonorrhea: NEGATIVE

## 2019-05-08 LAB — T-HELPER CELL (CD4) - (RCID CLINIC ONLY)
CD4 % Helper T Cell: 33 % (ref 33–65)
CD4 T Cell Abs: 852 /uL (ref 400–1790)

## 2019-05-10 LAB — CBC WITH DIFFERENTIAL/PLATELET
Absolute Monocytes: 517 cells/uL (ref 200–950)
Basophils Absolute: 32 cells/uL (ref 0–200)
Basophils Relative: 0.5 %
Eosinophils Absolute: 101 cells/uL (ref 15–500)
Eosinophils Relative: 1.6 %
HCT: 44.6 % (ref 38.5–50.0)
Hemoglobin: 15.4 g/dL (ref 13.2–17.1)
Lymphs Abs: 2835 cells/uL (ref 850–3900)
MCH: 33.5 pg — ABNORMAL HIGH (ref 27.0–33.0)
MCHC: 34.5 g/dL (ref 32.0–36.0)
MCV: 97 fL (ref 80.0–100.0)
MPV: 10.1 fL (ref 7.5–12.5)
Monocytes Relative: 8.2 %
Neutro Abs: 2816 cells/uL (ref 1500–7800)
Neutrophils Relative %: 44.7 %
Platelets: 271 10*3/uL (ref 140–400)
RBC: 4.6 10*6/uL (ref 4.20–5.80)
RDW: 12.2 % (ref 11.0–15.0)
Total Lymphocyte: 45 %
WBC: 6.3 10*3/uL (ref 3.8–10.8)

## 2019-05-10 LAB — COMPLETE METABOLIC PANEL WITH GFR
AG Ratio: 2 (calc) (ref 1.0–2.5)
ALT: 15 U/L (ref 9–46)
AST: 13 U/L (ref 10–40)
Albumin: 4.7 g/dL (ref 3.6–5.1)
Alkaline phosphatase (APISO): 69 U/L (ref 36–130)
BUN: 19 mg/dL (ref 7–25)
CO2: 30 mmol/L (ref 20–32)
Calcium: 10.1 mg/dL (ref 8.6–10.3)
Chloride: 104 mmol/L (ref 98–110)
Creat: 1.11 mg/dL (ref 0.60–1.35)
GFR, Est African American: 100 mL/min/{1.73_m2} (ref >=60)
GFR, Est Non African American: 86 mL/min/{1.73_m2} (ref >=60)
Globulin: 2.4 g/dL (calc) (ref 1.9–3.7)
Glucose, Bld: 91 mg/dL (ref 65–99)
Potassium: 4.1 mmol/L (ref 3.5–5.3)
Sodium: 142 mmol/L (ref 135–146)
Total Bilirubin: 0.4 mg/dL (ref 0.2–1.2)
Total Protein: 7.1 g/dL (ref 6.1–8.1)

## 2019-05-10 LAB — LIPID PANEL
Cholesterol: 202 mg/dL — ABNORMAL HIGH (ref <200)
HDL: 38 mg/dL — ABNORMAL LOW (ref >=40)
LDL Cholesterol (Calc): 125 mg/dL (calc) — ABNORMAL HIGH
Non-HDL Cholesterol (Calc): 164 mg/dL (calc) — ABNORMAL HIGH (ref <130)
Total CHOL/HDL Ratio: 5.3 (calc) — ABNORMAL HIGH (ref <5.0)
Triglycerides: 255 mg/dL — ABNORMAL HIGH (ref <150)

## 2019-05-10 LAB — RPR: RPR Ser Ql: NONREACTIVE

## 2019-05-10 LAB — HIV-1 RNA QUANT-NO REFLEX-BLD
HIV 1 RNA Quant: <20 copies/mL
HIV-1 RNA Quant, Log: <1.3 Log copies/mL

## 2019-08-27 ENCOUNTER — Telehealth: Payer: Self-pay | Admitting: Pharmacist

## 2019-08-27 NOTE — Telephone Encounter (Signed)
Faxed patient's Cabenuva enrollment form to [facility] today. They will assess patient's insurance and send a benefits investigation form detailing how to start the injections (where to get it from and cost). Once benefits investigation is complete, we will order oral lead-in therapy and start patient on treatment. Will update encounter with further details when available. 

## 2019-08-29 ENCOUNTER — Encounter: Payer: Self-pay | Admitting: Internal Medicine

## 2019-10-03 ENCOUNTER — Other Ambulatory Visit: Payer: Self-pay | Admitting: Internal Medicine

## 2019-10-16 ENCOUNTER — Telehealth: Payer: Self-pay

## 2019-10-16 NOTE — Telephone Encounter (Signed)
[name] Patient Advocate Encounter  Patient called expressing interest in the monthly Cabenuva injections.  I explained we are working on being able to administer at the clinic.  I mentioned I will let the pharmacist know of his interest.  Kathie Rhodes E. Dimas Aguas CPhT Specialty Pharmacy Patient [facility] for Infectious Disease Phone: [phone] Fax:  [phone]

## 2019-10-16 NOTE — Telephone Encounter (Signed)
Patient called office regarding medication; States that [facility] called to update him on Guinea approval. Is not sure how he is to go forward with injectable. Advised he call our pharmacy team for more information. Lorenso Courier, [place]

## 2019-10-24 ENCOUNTER — Other Ambulatory Visit: Payer: Self-pay

## 2019-10-24 DIAGNOSIS — B20 Human immunodeficiency virus [HIV] disease: Secondary | ICD-10-CM

## 2019-10-25 ENCOUNTER — Ambulatory Visit: Payer: Self-pay

## 2019-10-25 ENCOUNTER — Other Ambulatory Visit: Payer: Self-pay

## 2019-10-25 ENCOUNTER — Other Ambulatory Visit: Payer: 59

## 2019-10-25 DIAGNOSIS — B20 Human immunodeficiency virus [HIV] disease: Secondary | ICD-10-CM

## 2019-10-25 LAB — T-HELPER CELL (CD4) - (RCID CLINIC ONLY)
CD4 % Helper T Cell: 40 % (ref 33–65)
CD4 T Cell Abs: 967 /uL (ref 400–1790)

## 2019-10-29 ENCOUNTER — Telehealth: Payer: Self-pay | Admitting: *Deleted

## 2019-10-29 LAB — COMPLETE METABOLIC PANEL WITH GFR
AG Ratio: 1.7 (calc) (ref 1.0–2.5)
ALT: 15 U/L (ref 9–46)
AST: 15 U/L (ref 10–40)
Albumin: 4.4 g/dL (ref 3.6–5.1)
Alkaline phosphatase (APISO): 58 U/L (ref 36–130)
BUN: 13 mg/dL (ref 7–25)
CO2: 26 mmol/L (ref 20–32)
Calcium: 9.5 mg/dL (ref 8.6–10.3)
Chloride: 106 mmol/L (ref 98–110)
Creat: 1.06 mg/dL (ref 0.60–1.35)
GFR, Est African American: 106 mL/min/{1.73_m2} (ref >=60)
GFR, Est Non African American: 91 mL/min/{1.73_m2} (ref >=60)
Globulin: 2.6 g/dL (calc) (ref 1.9–3.7)
Glucose, Bld: 91 mg/dL (ref 65–99)
Potassium: 4.1 mmol/L (ref 3.5–5.3)
Sodium: 141 mmol/L (ref 135–146)
Total Bilirubin: 0.6 mg/dL (ref 0.2–1.2)
Total Protein: 7 g/dL (ref 6.1–8.1)

## 2019-10-29 LAB — CBC WITH DIFFERENTIAL/PLATELET
Absolute Monocytes: 459 cells/uL (ref 200–950)
Basophils Absolute: 18 cells/uL (ref 0–200)
Basophils Relative: 0.4 %
Eosinophils Absolute: 108 cells/uL (ref 15–500)
Eosinophils Relative: 2.4 %
HCT: 41.7 % (ref 38.5–50.0)
Hemoglobin: 14.2 g/dL (ref 13.2–17.1)
Lymphs Abs: 2484 cells/uL (ref 850–3900)
MCH: 32.5 pg (ref 27.0–33.0)
MCHC: 34.1 g/dL (ref 32.0–36.0)
MCV: 95.4 fL (ref 80.0–100.0)
MPV: 10.1 fL (ref 7.5–12.5)
Monocytes Relative: 10.2 %
Neutro Abs: 1431 cells/uL — ABNORMAL LOW (ref 1500–7800)
Neutrophils Relative %: 31.8 %
Platelets: 257 10*3/uL (ref 140–400)
RBC: 4.37 10*6/uL (ref 4.20–5.80)
RDW: 12.1 % (ref 11.0–15.0)
Total Lymphocyte: 55.2 %
WBC: 4.5 10*3/uL (ref 3.8–10.8)

## 2019-10-29 LAB — HIV-1 RNA QUANT-NO REFLEX-BLD
HIV 1 RNA Quant: <20 Copies/mL — ABNORMAL HIGH
HIV-1 RNA Quant, Log: <1.3 Log cps/mL — ABNORMAL HIGH

## 2019-10-29 NOTE — Telephone Encounter (Signed)
[facility] called for contact information, as they have not been able to reach patient for medication delivery.  Patient also has both Genvoya and Cabenuva on his profile, [facility] needs to confirm which medication patient should be taking. Per [name], patient has not yet started Cabenuva, needs Genvoya.   RN contacted patient, asked him to call [facility] (he has 4 pills left).  RN confirmed regimen with [facility]. Andree Coss, RN

## 2019-11-04 ENCOUNTER — Ambulatory Visit (INDEPENDENT_AMBULATORY_CARE_PROVIDER_SITE_OTHER): Payer: 59 | Admitting: Internal Medicine

## 2019-11-04 ENCOUNTER — Encounter: Payer: Self-pay | Admitting: Internal Medicine

## 2019-11-04 ENCOUNTER — Other Ambulatory Visit: Payer: Self-pay

## 2019-11-04 VITALS — BP 121/81 | HR 79 | Temp 98.2°F | Ht 70.0 in | Wt 199.0 lb

## 2019-11-04 DIAGNOSIS — B20 Human immunodeficiency virus [HIV] disease: Secondary | ICD-10-CM | POA: Diagnosis not present

## 2019-11-04 DIAGNOSIS — B351 Tinea unguium: Secondary | ICD-10-CM | POA: Diagnosis not present

## 2019-11-04 DIAGNOSIS — Z5181 Encounter for therapeutic drug level monitoring: Secondary | ICD-10-CM | POA: Insufficient documentation

## 2019-11-04 MED ORDER — TERBINAFINE HCL 250 MG PO TABS: 250.0000 mg | ORAL_TABLET | Freq: Every day | ORAL | 2 refills | Status: DC

## 2019-11-04 NOTE — Progress Notes (Signed)
° °  Subjective:    Patient ID: Jose Olsen, male    DOB: 11-01-1985, 34 y.o.   MRN: [id number]  HPI He is here for follow up of HIV He continues on Genvoya but unfortunately misses 1-2 doses a week while drinking alcohol.  He erroneously thought he could not take Genvoya with alcohol.  No new complaints.  Fortunately, CD4 of 967 and viral load of < 20.  Creat, LFTs wnl.  Waiting to start Cabaneuva.   Review of Systems  Constitutional: Negative for unexpected weight change.  Gastrointestinal: Negative for diarrhea and nausea.  Skin: Negative for rash.       Objective:   Physical Exam Constitutional:      Appearance: Normal appearance.  Eyes:     General: No scleral icterus. Pulmonary:     Effort: Pulmonary effort is normal.  Neurological:     General: No focal deficit present.     Mental Status: He is alert.  Psychiatric:        Mood and Affect: Mood normal.   SH: no tobacco        Assessment & Plan:

## 2019-11-04 NOTE — Assessment & Plan Note (Signed)
Will try lamisil now that he has insurance.  Will check a hepatic panel in 1 month and if ok, he will complete 12 weeks.

## 2019-11-04 NOTE — Assessment & Plan Note (Signed)
unfortunately missing doses but I discussed the importance of compliance, particularly if he is to start injectables, which he is anxious to start.  He understands he needs to be doing well to get the injectables.

## 2019-11-04 NOTE — Assessment & Plan Note (Signed)
Creat, LFTs wnl.   LFTs in 1 month on lamisil

## 2019-11-20 ENCOUNTER — Encounter: Payer: Self-pay | Admitting: Internal Medicine

## 2019-12-03 ENCOUNTER — Other Ambulatory Visit: Payer: Self-pay

## 2019-12-03 ENCOUNTER — Other Ambulatory Visit: Payer: 59

## 2019-12-03 DIAGNOSIS — B351 Tinea unguium: Secondary | ICD-10-CM

## 2019-12-03 LAB — HEPATIC FUNCTION PANEL
AG Ratio: 1.7 (calc) (ref 1.0–2.5)
ALT: 17 U/L (ref 9–46)
AST: 14 U/L (ref 10–40)
Albumin: 4.3 g/dL (ref 3.6–5.1)
Alkaline phosphatase (APISO): 60 U/L (ref 36–130)
Bilirubin, Direct: 0.1 mg/dL (ref 0.0–0.2)
Globulin: 2.5 g/dL (calc) (ref 1.9–3.7)
Indirect Bilirubin: 0.4 mg/dL (calc) (ref 0.2–1.2)
Total Bilirubin: 0.5 mg/dL (ref 0.2–1.2)
Total Protein: 6.8 g/dL (ref 6.1–8.1)

## 2019-12-31 ENCOUNTER — Other Ambulatory Visit: Payer: Self-pay | Admitting: Internal Medicine

## 2020-01-08 ENCOUNTER — Telehealth: Payer: Self-pay

## 2020-01-08 NOTE — Telephone Encounter (Signed)
My Chart Cabenuva Message  

## 2020-01-16 ENCOUNTER — Other Ambulatory Visit: Payer: Self-pay | Admitting: Internal Medicine

## 2020-03-12 ENCOUNTER — Telehealth: Payer: Self-pay | Admitting: Pharmacist

## 2020-03-12 ENCOUNTER — Telehealth: Payer: Self-pay

## 2020-03-12 DIAGNOSIS — B20 Human immunodeficiency virus [HIV] disease: Secondary | ICD-10-CM

## 2020-03-12 MED ORDER — EDURANT 25 MG PO TABS: 25.0000 mg | ORAL_TABLET | Freq: Every day | ORAL | 0 refills | Status: DC

## 2020-03-12 MED ORDER — VOCABRIA 30 MG PO TABS: 1.0000 | ORAL_TABLET | Freq: Every day | ORAL | 0 refills | Status: DC

## 2020-03-12 NOTE — Telephone Encounter (Signed)
Received call from Daryl at [facility] wanting to confirm patient's oral lead-in therapy. Per Aggie Cosier, she already has the shipment and patient is to start the therapy 03/18/20.  Sandie Ano, RN

## 2020-03-12 NOTE — Telephone Encounter (Signed)
Called patient to discuss starting Cabenuva. He is still interested and excited to start. He will come in next Wednesday 12/29 to start his oral lead-in therapy. Initial injection appointment will be made with Dr. Luciana Axe during that visit.

## 2020-03-18 ENCOUNTER — Ambulatory Visit (INDEPENDENT_AMBULATORY_CARE_PROVIDER_SITE_OTHER): Payer: 59 | Admitting: Pharmacist

## 2020-03-18 ENCOUNTER — Other Ambulatory Visit: Payer: Self-pay

## 2020-03-18 DIAGNOSIS — B20 Human immunodeficiency virus [HIV] disease: Secondary | ICD-10-CM

## 2020-03-18 DIAGNOSIS — Z23 Encounter for immunization: Secondary | ICD-10-CM

## 2020-03-18 MED ORDER — CABOTEGRAVIR & RILPIVIRINE ER 600 & 900 MG/3ML IM SUER: 1.0000 | Freq: Once | INTRAMUSCULAR | 0 refills | Status: DC

## 2020-03-18 NOTE — Progress Notes (Signed)
HPI: Jose Olsen is a 34 y.o. male who presents to the [name] pharmacy clinic for HIV follow-up.  Patient Active Problem List   Diagnosis Date Noted  . Medication monitoring encounter 11/04/2019  . HTN (hypertension) 05/07/2019  . Screening examination for venereal disease 03/28/2016  . Encounter for long-term (current) use of high-risk medication 03/28/2016  . Human immunodeficiency virus (HIV) disease (HCC) 12/02/2010  . ONYCHOMYCOSIS 05/19/2009    [name] Medications  New Prescriptions   No medications on file  Previous Medications   CABOTEGRAVIR SODIUM (VOCABRIA) 30 MG TABS    Take 1 tablet by mouth daily.   GENVOYA 150-150-200-10 MG TABS TABLET    TAKE 1 TABLET BY MOUTH EVERY DAY WITH BREAKFAST   RILPIVIRINE (EDURANT) 25 MG TABS TABLET    Take 1 tablet (25 mg total) by mouth daily with breakfast.   TERBINAFINE (LAMISIL) 250 MG TABLET    Take 1 tablet (250 mg total) by mouth daily.  Modified Medications   No medications on file  Discontinued Medications   No medications on file    Allergies: No Known Allergies  Past Medical History: Past Medical History:  Diagnosis Date  . Chicken pox   . HIV infection [facility])     Social History: Social History   Socioeconomic History  . Marital status: Single    Spouse name: Not on file  . Number of children: Not on file  . Years of education: Not on file  . Highest education level: Not on file  Occupational History  . Not on file  Tobacco Use  . Smoking status: Never Smoker  . Smokeless tobacco: Never Used  Substance and Sexual Activity  . Alcohol use: Yes    Comment: rarely  . Drug use: No    Frequency: 3.0 times per week  . Sexual activity: Yes    Partners: Male    Birth control/protection: Condom    Comment: pt. accepted condoms  Other Topics Concern  . Not on file  Social History Narrative   Occupation:  Unemployed   Never Smoked   Alcohol use-yes   Regular exercise-no   Social Determinants of Health    Financial Resource Strain: Not on file  Food Insecurity: Not on file  Transportation Needs: Not on file  Physical Activity: Not on file  Stress: Not on file  Social Connections: Not on file    Labs: Lab Results  Component Value Date   HIV1RNAQUANT <20 (H) 10/25/2019   HIV1RNAQUANT <20 NOT DETECTED 05/07/2019   HIV1RNAQUANT 29 (H) 10/31/2018   CD4TABS 967 10/25/2019   CD4TABS 852 05/07/2019   CD4TABS 729 10/31/2018    RPR and STI Lab Results  Component Value Date   LABRPR NON-REACTIVE 05/07/2019   LABRPR NON-REACTIVE 08/14/2018   LABRPR NON REAC 03/28/2016   LABRPR NON REAC 12/14/2012   LABRPR NON REAC 11/11/2010    STI Results GC GC CT CT  Latest Ref Rng & Units - NEGATIVE - NEGATIVE  05/07/2019 Negative - Negative -  05/07/2019 Negative - Negative -  05/07/2019 Negative - Negative -  08/14/2018 Negative - Negative -  10/10/2013 - POSITIVE(A) - NEGATIVE    Hepatitis B Lab Results  Component Value Date   HEPBSAB NEG 11/11/2010   HEPBSAG NEGATIVE 11/11/2010   Hepatitis C No results found for: HEPCAB, HCVRNAPCRQN Hepatitis A No results found for: HAV Lipids: Lab Results  Component Value Date   CHOL 202 (H) 05/07/2019   TRIG 255 (H) 05/07/2019  HDL 38 (L) 05/07/2019   CHOLHDL 5.3 (H) 05/07/2019   VLDL 19 12/14/2012   LDLCALC 125 (H) 05/07/2019    Current HIV Regimen: Genvoya  Assessment: Jose Olsen is here to discuss and initiate Cabenuva.  Discussed the process of starting Cabenuva injections including the need to take oral lead-in therapy for 28-30 days to assess tolerability to cabotegravir. Counseled patient to take one tablet of rilpivirine + one tablet of cabotegravir once daily WITH food. Encouraged patient to take the tablets around the same time each day and to make sure it is with a meal. Advised to never take one without the other. Discussed possible side effects such as nausea, headache, insomnia, and abnormal dreams. Explained the importance of not  missing any doses of the 30 day regimen to make sure no resistance develops. Explained to patient that we will set a target date each month to administer injections. Explained the importance of showing up to appointments and not missing any monthly injections. Warned that if patient misses 2 appointments, it will be reassessed by their provider whether they are a good candidate for injection therapy. Made appointment for patient to come in and receive initial injection 28-30 days after starting oral lead-in therapy.  Counseled that Guinea is two separate intramuscular injections in the gluteal muscle on each side for each monthly visit. Cautioned on possible side effects such as injection-site reactions, fatigue, headache, nasuea, rash, and dizziness.  Will make patient's follow up appointments for 6 months to help with compliance. Answered all questions. Gave patient my card to call me with any issues.  Plan: - Stop Genvoya - Start [place] + Edurant once daily with food x 30 days - Initial Cabenuva injection appointment scheduled with Dr. Luciana Axe on 1/26 at 1030am - Follow up Cabenuva injection appointments scheduled through 6 months - Sent injection prescription to [facility] in [place] to mail to clinic for administration - Call with any issues or questions  Donn Zanetti L. Netha Dafoe, PharmD, BCIDP, AAHIVP, CPP Clinical Pharmacist Practitioner Infectious Diseases Clinical Pharmacist [facility] 03/18/2020, 4:00 PM

## 2020-03-23 ENCOUNTER — Telehealth: Payer: Self-pay

## 2020-03-23 NOTE — Telephone Encounter (Signed)
[name] Patient Advocate Encounter   Received notification from [facility] that prior authorization for CABENUVA is required.   PA submitted on 03/23/20 Key [id number] Status is pending    [name] Clinic will continue to follow.   [name], CPhT Specialty Pharmacy Patient [facility] for Infectious Disease Phone: [phone] Fax:  [phone]

## 2020-03-24 ENCOUNTER — Telehealth: Payer: Self-pay | Admitting: Pharmacist

## 2020-03-24 NOTE — Telephone Encounter (Signed)
Oh no, hopefully it will go through.  thanks

## 2020-03-24 NOTE — Telephone Encounter (Signed)
When I met with patient last week, he told me that he had Jose Olsen/HMAP so we proceeded with starting the oral lead-in of Cabenuva. I learned this week that he signed up for marketplace insurance and PCAP with [facility]. They denied initial PA for Cabenuva. Submitted appeal today asking for approval as he has already started the medication.

## 2020-03-25 ENCOUNTER — Other Ambulatory Visit: Payer: Self-pay | Admitting: Pharmacist

## 2020-03-25 ENCOUNTER — Telehealth: Payer: Self-pay

## 2020-03-25 DIAGNOSIS — B20 Human immunodeficiency virus [HIV] disease: Secondary | ICD-10-CM

## 2020-03-25 MED ORDER — CABOTEGRAVIR & RILPIVIRINE ER 600 & 900 MG/3ML IM SUER: 1.0000 | Freq: Once | INTRAMUSCULAR | 0 refills | Status: AC

## 2020-03-25 MED ORDER — CABOTEGRAVIR & RILPIVIRINE ER 600 & 900 MG/3ML IM SUER: 1.0000 | Freq: Once | INTRAMUSCULAR | 0 refills | Status: DC

## 2020-03-25 NOTE — Telephone Encounter (Signed)
[name] denial was overturned and appeal was approved to receive Cabenuva for 12 months.

## 2020-03-25 NOTE — Telephone Encounter (Addendum)
[name] Patient Advocate Encounter  Prior Authorization for CABENUVA has been approved.    PA# [id number] Effective dates: 03/25/20 through 03/24/21  The prescription have to go through [facility]  [id number] Patient Co-Pay $1000.00 I was able to get a Co-Pay Coupon Card BIN: [id number] PCN: [id number] GROUP: [id number] ID: [id number] That will make his co-pay $0.00.  [name] will continue to follow.  [name], CPhT Specialty Pharmacy Patient [facility] for Infectious Disease Phone: [phone] Fax:  [phone]

## 2020-03-25 NOTE — Addendum Note (Signed)
Addended by: Aggie Cosier L on: 03/25/2020 11:10 AM   Modules accepted: Orders

## 2020-03-25 NOTE — Telephone Encounter (Signed)
It was approved for 12 months! Thank goodness.

## 2020-03-25 NOTE — Progress Notes (Addendum)
[name] insurance requires Cabenuva to be filled at [facility]. Sending Rx there now. Jose Olsen will call and coordinate. He comes in for his initial injection on 1/26.

## 2020-04-10 ENCOUNTER — Telehealth: Payer: Self-pay

## 2020-04-10 NOTE — Telephone Encounter (Signed)
[name] Patient Advocate Encounter  [name] medication [name]) have been delivered to [facility] from [facility] and will be administered on patient next appointment on 04/15/20.  [name] , CPhT Specialty Pharmacy Patient [facility] for Infectious Disease Phone: [phone] Fax:  [phone]

## 2020-04-15 ENCOUNTER — Ambulatory Visit (INDEPENDENT_AMBULATORY_CARE_PROVIDER_SITE_OTHER): Payer: 59 | Admitting: Internal Medicine

## 2020-04-15 ENCOUNTER — Other Ambulatory Visit: Payer: Self-pay

## 2020-04-15 ENCOUNTER — Encounter: Payer: Self-pay | Admitting: Internal Medicine

## 2020-04-15 VITALS — BP 135/96 | HR 75 | Temp 97.7°F | Wt 214.0 lb

## 2020-04-15 DIAGNOSIS — Z5181 Encounter for therapeutic drug level monitoring: Secondary | ICD-10-CM | POA: Diagnosis not present

## 2020-04-15 DIAGNOSIS — B20 Human immunodeficiency virus [HIV] disease: Secondary | ICD-10-CM

## 2020-04-15 MED ORDER — CABOTEGRAVIR & RILPIVIRINE ER 600 & 900 MG/3ML IM SUER
1.0000 | Freq: Once | INTRAMUSCULAR | Status: AC
  Administered 2020-04-15: 1 via INTRAMUSCULAR

## 2020-04-15 NOTE — Assessment & Plan Note (Signed)
No issues with the oral lead in medication with any reported side effects.  Will proceed as above.

## 2020-04-15 NOTE — Assessment & Plan Note (Addendum)
Cabenuva injection today with observation for 15 minutes.  No issues after 15 minutes.   Follow up arranged for next month. Will check labs next month.

## 2020-04-15 NOTE — Progress Notes (Signed)
   Subjective:    Patient ID: Jose Olsen, male    DOB: [birth year], 35 y.o.   MRN: [id number]  HPI Here for follow up of HIV He has started oral lead in therapy for Cabenuva with oral lead in with rilpivirine and cabotegravir.  He missed no doses, no side effects.  Pleased to start the injections.    Review of Systems  Constitutional: Negative for fatigue.  Skin: Negative for rash.  Neurological: Negative for headaches.       Objective:   Physical Exam Eyes:     General: No scleral icterus. Neurological:     General: No focal deficit present.     Mental Status: He is alert.  Psychiatric:        Mood and Affect: Mood normal.           Assessment & Plan:

## 2020-04-15 NOTE — Progress Notes (Signed)
1st  Intramuscular dose of Cabenuva given  Rilpivirine extended -release injectable suspension 900mg /32ml given right upper outer gluteus.  Cabotegravir extended -release injectable suspension 600mg /ml given left right upper outer gluteus . Patient tolerated procedure well.  Patient observed 15 minutes and no reactions noted.  He was advised to return in one month for next injection.   1m, RN

## 2020-04-21 ENCOUNTER — Other Ambulatory Visit: Payer: 59

## 2020-05-05 ENCOUNTER — Encounter: Payer: 59 | Admitting: Internal Medicine

## 2020-05-11 ENCOUNTER — Other Ambulatory Visit: Payer: Self-pay | Admitting: Family

## 2020-05-11 MED ORDER — CABOTEGRAVIR & RILPIVIRINE ER 400 & 600 MG/2ML IM SUER: 1.0000 | INTRAMUSCULAR | 11 refills | Status: DC

## 2020-05-15 ENCOUNTER — Telehealth: Payer: Self-pay

## 2020-05-15 NOTE — Telephone Encounter (Signed)
[name] Patient Advocate Encounter  [name] medication Jose Olsen) have been delivered to [facility] from [facility] and will administered on patient next appointment on 05/19/20.  [name] , CPhT Specialty Pharmacy Patient [facility] for Infectious Disease Phone: [phone] Fax:  [phone]

## 2020-05-19 ENCOUNTER — Encounter: Payer: Self-pay | Admitting: Internal Medicine

## 2020-05-19 ENCOUNTER — Ambulatory Visit (INDEPENDENT_AMBULATORY_CARE_PROVIDER_SITE_OTHER): Payer: 59 | Admitting: Internal Medicine

## 2020-05-19 ENCOUNTER — Other Ambulatory Visit: Payer: Self-pay

## 2020-05-19 ENCOUNTER — Ambulatory Visit: Payer: 59 | Admitting: Internal Medicine

## 2020-05-19 VITALS — BP 120/82 | HR 80 | Temp 98.0°F | Ht 70.0 in | Wt 210.0 lb

## 2020-05-19 DIAGNOSIS — B20 Human immunodeficiency virus [HIV] disease: Secondary | ICD-10-CM | POA: Diagnosis not present

## 2020-05-19 DIAGNOSIS — I10 Essential (primary) hypertension: Secondary | ICD-10-CM | POA: Diagnosis not present

## 2020-05-19 MED ORDER — CABOTEGRAVIR & RILPIVIRINE ER 400 & 600 MG/2ML IM SUER
1.0000 | Freq: Once | INTRAMUSCULAR | Status: AC
  Administered 2020-05-19: 1 via INTRAMUSCULAR

## 2020-05-19 MED ORDER — CABOTEGRAVIR & RILPIVIRINE ER 600 & 900 MG/3ML IM SUER: 1.0000 | Freq: Once | INTRAMUSCULAR | Status: DC

## 2020-05-19 NOTE — Assessment & Plan Note (Signed)
Cabenuva injection today.  rtc in 1 month, already scheduled. Will check his labs today

## 2020-05-19 NOTE — Assessment & Plan Note (Signed)
His BP has remained stable off of medication.

## 2020-05-19 NOTE — Progress Notes (Signed)
   Subjective:    Patient ID: Jose Olsen, male    DOB: [birth year], 35 y.o.   MRN: [id number]  HPI Here for follow up of HIV He continues on Cabenuva and due for his continuation of treatment with 400/600 mg of cabotegravir and rilpivirine.  He has some residual soreness of his buttock but overall no issues including no rash or other concerns.     Review of Systems  Constitutional: Negative for fatigue and unexpected weight change.  Gastrointestinal: Negative for diarrhea and nausea.  Skin: Negative for rash.       Objective:   Physical Exam Eyes:     General: No scleral icterus. Pulmonary:     Effort: Pulmonary effort is normal.  Neurological:     General: No focal deficit present.     Mental Status: He is alert.  Psychiatric:        Mood and Affect: Mood normal.           Assessment & Plan:

## 2020-05-20 LAB — T-HELPER CELL (CD4) - (RCID CLINIC ONLY)
CD4 % Helper T Cell: 38 % (ref 33–65)
CD4 T Cell Abs: 940 /uL (ref 400–1790)

## 2020-05-21 LAB — HIV-1 RNA QUANT-NO REFLEX-BLD
HIV 1 RNA Quant: NOT DETECTED Copies/mL
HIV-1 RNA Quant, Log: NOT DETECTED Log cps/mL

## 2020-05-26 ENCOUNTER — Encounter: Payer: Self-pay | Admitting: Pharmacist

## 2020-06-11 ENCOUNTER — Telehealth: Payer: Self-pay

## 2020-06-11 NOTE — Telephone Encounter (Signed)
[name] Patient Advocate Encounter  [name] medication [name]) have been delivered to [facility] from [facility] and will be administered on next appointment on 06/16/20.  [name] , CPhT Specialty Pharmacy Patient [facility] for Infectious Disease Phone: [phone] Fax:  [phone]

## 2020-06-16 ENCOUNTER — Encounter: Payer: Self-pay | Admitting: Family

## 2020-06-16 ENCOUNTER — Other Ambulatory Visit (HOSPITAL_COMMUNITY)
Admission: RE | Admit: 2020-06-16 | Discharge: 2020-06-16 | Disposition: A | Discharge status: Home / Self Care | Payer: 59 | Source: Ambulatory Visit | Attending: Family | Admitting: Family

## 2020-06-16 ENCOUNTER — Ambulatory Visit (INDEPENDENT_AMBULATORY_CARE_PROVIDER_SITE_OTHER): Payer: 59 | Admitting: Family

## 2020-06-16 ENCOUNTER — Other Ambulatory Visit: Payer: Self-pay

## 2020-06-16 VITALS — Wt 207.0 lb

## 2020-06-16 DIAGNOSIS — Z113 Encounter for screening for infections with a predominantly sexual mode of transmission: Secondary | ICD-10-CM | POA: Diagnosis present

## 2020-06-16 DIAGNOSIS — B20 Human immunodeficiency virus [HIV] disease: Secondary | ICD-10-CM

## 2020-06-16 MED ORDER — CABOTEGRAVIR & RILPIVIRINE ER 400 & 600 MG/2ML IM SUER
1.0000 | Freq: Once | INTRAMUSCULAR | Status: AC
  Administered 2020-06-16: 1 via INTRAMUSCULAR

## 2020-06-16 NOTE — Assessment & Plan Note (Signed)
Jose Olsen continues to have well-controlled HIV disease with good tolerance to his monthly Cabenuva.  No signs/symptoms of opportunistic infection or progressive HIV disease.  Reviewed lab work and discussed plan of care.  Introduced every 2 monthly injections and he wishes to continue with monthly injections at this time.  Check blood work today.  Plan for follow-up in 1 month or sooner if needed.

## 2020-06-16 NOTE — Assessment & Plan Note (Signed)
Jose Olsen has concern for exposure to STD with no current symptoms.  Discussed importance of safe sexual practice to reduce risk of STI.  Check RPR, urine, and swabs.  Treatment as needed pending blood work results.

## 2020-06-16 NOTE — Patient Instructions (Signed)
Nice to see you.  We will check your lab work today and arrange and treatments as needed.  Plan for follow up in 1 month or sooner if needed.   Have a great day and stay safe!

## 2020-06-16 NOTE — Addendum Note (Signed)
Addended by: Rosanna Randy on: 06/16/2020 12:04 PM   Modules accepted: Orders

## 2020-06-16 NOTE — Progress Notes (Signed)
Subjective:    Patient ID: Jose Olsen, male    DOB: 1985/12/08, 35 y.o.   MRN: [id number]  Chief Complaint  Patient presents with  . Follow-up    Reports improved pain with second shot; "much better then first one"; declined condoms; requesting sti testing; possible sti exposure with previous partner; no reported signs or symptoms;      HPI:  Jose Olsen is a 35 y.o. male with HIV disease last seen on 05/19/2020 for second dose of monthly Cabenuva having some residual soreness from initial injection and no other complications.  Viral load at the time was undetectable CD4 count of 940.  Here today for monthly injection.  Jose Olsen has been doing well since his previous injection with decreased amount of soreness compared to his initial injection.  No other adverse side effects.  Overall feeling well today.  Concern for possible STI with recent exposure with previous partner.  Not currently experiencing symptoms.   Denies fevers, chills, night sweats, headaches, changes in vision, neck pain/stiffness, nausea, diarrhea, vomiting, lesions or rashes.   No Known Allergies    Outpatient Medications Prior to Visit  Medication Sig Dispense Refill  . cabotegravir & rilpivirine ER (CABENUVA) 400 & 600 MG/2ML injection Inject 1 kit into the muscle every 30 (thirty) days. 4 mL 11   No facility-administered medications prior to visit.     Past Medical History:  Diagnosis Date  . Chicken pox   . HIV infection ([place])      History reviewed. No pertinent surgical history.     Review of Systems  Constitutional: Negative for appetite change, chills, fatigue, fever and unexpected weight change.  Eyes: Negative for visual disturbance.  Respiratory: Negative for cough, chest tightness, shortness of breath and wheezing.   Cardiovascular: Negative for chest pain and leg swelling.  Gastrointestinal: Negative for abdominal pain, constipation, diarrhea, nausea and vomiting.  Genitourinary:  Negative for dysuria, flank pain, frequency, genital sores, hematuria and urgency.  Skin: Negative for rash.  Allergic/Immunologic: Negative for immunocompromised state.  Neurological: Negative for dizziness and headaches.      Objective:    Wt 207 lb (93.9 kg)   BMI 29.70 kg/m  Nursing note and vital signs reviewed.  Physical Exam Constitutional:      General: He is not in acute distress.    Appearance: He is well-developed.  Eyes:     Conjunctiva/sclera: Conjunctivae normal.  Cardiovascular:     Rate and Rhythm: Normal rate and regular rhythm.     Heart sounds: Normal heart sounds. No murmur heard. No friction rub. No gallop.   Pulmonary:     Effort: Pulmonary effort is normal. No respiratory distress.     Breath sounds: Normal breath sounds. No wheezing or rales.  Chest:     Chest wall: No tenderness.  Abdominal:     General: Bowel sounds are normal.     Palpations: Abdomen is soft.     Tenderness: There is no abdominal tenderness.  Musculoskeletal:     Cervical back: Neck supple.  Lymphadenopathy:     Cervical: No cervical adenopathy.  Skin:    General: Skin is warm and dry.     Findings: No rash.  Neurological:     Mental Status: He is alert and oriented to person, place, and time.  Psychiatric:        Behavior: Behavior normal.        Thought Content: Thought content normal.  Judgment: Judgment normal.      Depression screen [facility] 2/9 06/16/2020 05/19/2020 05/07/2019 10/31/2018 09/04/2018  Decreased Interest 0 0 0 0 0  Down, Depressed, Hopeless 0 0 0 0 0  PHQ - 2 Score 0 0 0 0 0  Altered sleeping - - - - -  Tired, decreased energy - - - - -  Change in appetite - - - - -  Feeling bad or failure about yourself  - - - - -  Trouble concentrating - - - - -  Moving slowly or fidgety/restless - - - - -  Suicidal thoughts - - - - -  PHQ-9 Score - - - - -       Assessment & Plan:    Patient Active Problem List   Diagnosis Date Noted  . Screening for STDs  (sexually transmitted diseases) 06/16/2020  . Medication monitoring encounter 11/04/2019  . HTN (hypertension) 05/07/2019  . Screening examination for venereal disease 03/28/2016  . Encounter for long-term (current) use of high-risk medication 03/28/2016  . Human immunodeficiency virus (HIV) disease ([place]) 12/02/2010  . ONYCHOMYCOSIS 05/19/2009     Problem List Items Addressed This Visit      Other   Human immunodeficiency virus (HIV) disease ([place]) - Primary    Jose Olsen continues to have well-controlled HIV disease with good tolerance to his monthly Cabenuva.  No signs/symptoms of opportunistic infection or progressive HIV disease.  Reviewed lab work and discussed plan of care.  Introduced every 2 monthly injections and he wishes to continue with monthly injections at this time.  Check blood work today.  Plan for follow-up in 1 month or sooner if needed.      Relevant Orders   T-helper cell (CD4)- ([name] clinic only)   HIV-1 RNA quant-no reflex-bld   Screening for STDs (sexually transmitted diseases)    Jose Olsen has concern for exposure to STD with no current symptoms.  Discussed importance of safe sexual practice to reduce risk of STI.  Check RPR, urine, and swabs.  Treatment as needed pending blood work results.      Relevant Orders   Cytology (oral, anal, urethral) ancillary only   Cytology (oral, anal, urethral) ancillary only   Urine cytology ancillary only([place])   RPR       I am having Jose Harbour "Jaime Grizzell" maintain his cabotegravir & rilpivirine ER.    Follow-up: Return in about 1 month (around 07/17/2020), or if symptoms worsen or fail to improve.   Terri Piedra, MSN, FNP-C Nurse Practitioner [facility] for Infectious Disease [place] number: [phone]

## 2020-06-17 LAB — URINE CYTOLOGY ANCILLARY ONLY
Chlamydia: NEGATIVE
Comment: NEGATIVE
Comment: NORMAL
Neisseria Gonorrhea: NEGATIVE

## 2020-06-17 LAB — T-HELPER CELL (CD4) - (RCID CLINIC ONLY)
CD4 % Helper T Cell: 37 % (ref 33–65)
CD4 T Cell Abs: 631 /uL (ref 400–1790)

## 2020-06-17 LAB — CYTOLOGY, (ORAL, ANAL, URETHRAL) ANCILLARY ONLY
Chlamydia: NEGATIVE
Chlamydia: NEGATIVE
Comment: NEGATIVE
Comment: NEGATIVE
Comment: NORMAL
Comment: NORMAL
Neisseria Gonorrhea: NEGATIVE
Neisseria Gonorrhea: NEGATIVE

## 2020-06-18 LAB — RPR: RPR Ser Ql: NONREACTIVE

## 2020-06-18 LAB — HIV-1 RNA QUANT-NO REFLEX-BLD
HIV 1 RNA Quant: NOT DETECTED Copies/mL
HIV-1 RNA Quant, Log: NOT DETECTED Log cps/mL

## 2020-07-10 ENCOUNTER — Telehealth: Payer: Self-pay

## 2020-07-10 NOTE — Telephone Encounter (Signed)
[name] Patient Advocate Encounter  [name] medication (CABENUVA) have been couriered to [name] from [facility] and will be administered on patient next appointment on 07/17/20.  [name] , CPhT Specialty Pharmacy Patient [facility] for Infectious Disease Phone: [phone] Fax:  [phone]

## 2020-07-14 ENCOUNTER — Ambulatory Visit (INDEPENDENT_AMBULATORY_CARE_PROVIDER_SITE_OTHER): Payer: 59 | Admitting: Family

## 2020-07-14 ENCOUNTER — Encounter: Payer: Self-pay | Admitting: Family

## 2020-07-14 ENCOUNTER — Other Ambulatory Visit: Payer: Self-pay

## 2020-07-14 VITALS — BP 120/79 | HR 71 | Temp 98.1°F | Wt 210.0 lb

## 2020-07-14 DIAGNOSIS — B20 Human immunodeficiency virus [HIV] disease: Secondary | ICD-10-CM

## 2020-07-14 MED ORDER — CABOTEGRAVIR & RILPIVIRINE ER 400 & 600 MG/2ML IM SUER
1.0000 | Freq: Once | INTRAMUSCULAR | Status: AC
  Administered 2020-07-14: 1 via INTRAMUSCULAR

## 2020-07-14 NOTE — Assessment & Plan Note (Signed)
Mr. Godeaux continues to have well-controlled HIV disease with good adherence and tolerance to [place]. Monthly injection of 400/600 provided without complication. Check blood work at next office visit. Plan for follow up in 1 month or sooner if needed.

## 2020-07-14 NOTE — Patient Instructions (Signed)
Nice to see you.  Please let us know if you have any questions.  Plan for follow up in 1 month or sooner if needed.   Have a great day and stay safe!

## 2020-07-14 NOTE — Progress Notes (Signed)
Patient ID: Hanna Aultman, male    DOB: 04-Sep-1985, 35 y.o.   MRN: [id number]    Subjective:    Chief Complaint  Patient presents with  . Follow-up    B20-Cabenuva     HPI:  Valdemar Mcclenahan is a 35 y.o. male with HIV disease last seen on 06/16/2020 for monthly injection of Cabenuva. Viral load at the time was undetectable and CD4 count was 631. Here today for routine follow up.  Mr. Brannen has been doing well since his last injection with no new concerns/complaints.  Overall feeling well today. Denies fevers, chills, night sweats, headaches, changes in vision, neck pain/stiffness, nausea, diarrhea, vomiting, lesions or rashes.   No Known Allergies    Outpatient Medications Prior to Visit  Medication Sig Dispense Refill  . cabotegravir & rilpivirine ER (CABENUVA) 400 & 600 MG/2ML injection Inject 1 kit into the muscle every 30 (thirty) days. 4 mL 11   No facility-administered medications prior to visit.     Past Medical History:  Diagnosis Date  . Chicken pox   . HIV infection ([place])      History reviewed. No pertinent surgical history.     Review of Systems  Constitutional: Negative for appetite change, chills, fatigue, fever and unexpected weight change.  Eyes: Negative for visual disturbance.  Respiratory: Negative for cough, chest tightness, shortness of breath and wheezing.   Cardiovascular: Negative for chest pain and leg swelling.  Gastrointestinal: Negative for abdominal pain, constipation, diarrhea, nausea and vomiting.  Genitourinary: Negative for dysuria, flank pain, frequency, genital sores, hematuria and urgency.  Skin: Negative for rash.  Allergic/Immunologic: Negative for immunocompromised state.  Neurological: Negative for dizziness and headaches.      Objective:    BP 120/79   Pulse 71   Temp 98.1 F (36.7 C) (Oral)   Wt 210 lb (95.3 kg)   BMI 30.13 kg/m  Nursing note and vital signs reviewed.  Physical Exam Constitutional:       General: He is not in acute distress.    Appearance: He is well-developed.  Eyes:     Conjunctiva/sclera: Conjunctivae normal.  Cardiovascular:     Rate and Rhythm: Normal rate and regular rhythm.     Heart sounds: Normal heart sounds. No murmur heard. No friction rub. No gallop.   Pulmonary:     Effort: Pulmonary effort is normal. No respiratory distress.     Breath sounds: Normal breath sounds. No wheezing or rales.  Chest:     Chest wall: No tenderness.  Abdominal:     General: Bowel sounds are normal.     Palpations: Abdomen is soft.     Tenderness: There is no abdominal tenderness.  Musculoskeletal:     Cervical back: Neck supple.  Lymphadenopathy:     Cervical: No cervical adenopathy.  Skin:    General: Skin is warm and dry.     Findings: No rash.  Neurological:     Mental Status: He is alert and oriented to person, place, and time.  Psychiatric:        Behavior: Behavior normal.        Thought Content: Thought content normal.        Judgment: Judgment normal.      Depression screen [facility] 2/9 07/14/2020 06/16/2020 05/19/2020 05/07/2019 10/31/2018  Decreased Interest 0 0 0 0 0  Down, Depressed, Hopeless 0 0 0 0 0  PHQ - 2 Score 0 0 0 0 0  Altered sleeping - - - - -  Tired, decreased energy - - - - -  Change in appetite - - - - -  Feeling bad or failure about yourself  - - - - -  Trouble concentrating - - - - -  Moving slowly or fidgety/restless - - - - -  Suicidal thoughts - - - - -  PHQ-9 Score - - - - -       Assessment & Plan:    Patient Active Problem List   Diagnosis Date Noted  . Screening for STDs (sexually transmitted diseases) 06/16/2020  . Medication monitoring encounter 11/04/2019  . HTN (hypertension) 05/07/2019  . Screening examination for venereal disease 03/28/2016  . Encounter for long-term (current) use of high-risk medication 03/28/2016  . Human immunodeficiency virus (HIV) disease ([place]) 12/02/2010  . ONYCHOMYCOSIS 05/19/2009     Problem  List Items Addressed This Visit      Other   Human immunodeficiency virus (HIV) disease ([place]) - Primary    Mr. Micheletti continues to have well-controlled HIV disease with good adherence and tolerance to [place]. Monthly injection of 400/600 provided without complication. Check blood work at next office visit. Plan for follow up in 1 month or sooner if needed.           I am having Rip Harbour "Estaban [place]" maintain his cabotegravir & rilpivirine ER. We administered cabotegravir & rilpivirine ER.   Meds ordered this encounter  Medications  . cabotegravir & rilpivirine ER (CABENUVA) 400 & 600 MG/2ML injection 1 kit     Follow-up: Return in about 1 month (around 08/13/2020), or if symptoms worsen or fail to improve.   Terri Piedra, MSN, FNP-C Nurse Practitioner [facility] for Infectious Disease [place] number: [phone]

## 2020-08-11 ENCOUNTER — Telehealth: Payer: Self-pay

## 2020-08-11 NOTE — Telephone Encounter (Signed)
[name] Patient Advocate Encounter  [name] medication Jose Olsen) have been delivered to [facility] from [facility] and will be administered on patient next office visit on 08/18/20.  [name] , CPhT Specialty Pharmacy Patient [facility] for Infectious Disease Phone: [phone] Fax:  [phone]

## 2020-08-18 ENCOUNTER — Ambulatory Visit: Payer: 59 | Admitting: Internal Medicine

## 2020-08-21 ENCOUNTER — Ambulatory Visit: Payer: 59 | Admitting: Infectious Diseases

## 2020-08-21 ENCOUNTER — Encounter: Payer: Self-pay | Admitting: Infectious Diseases

## 2020-08-21 ENCOUNTER — Other Ambulatory Visit: Payer: Self-pay

## 2020-08-21 VITALS — BP 137/92 | HR 83 | Resp 16 | Ht 70.0 in | Wt 209.5 lb

## 2020-08-21 DIAGNOSIS — B20 Human immunodeficiency virus [HIV] disease: Secondary | ICD-10-CM

## 2020-08-21 MED ORDER — CABOTEGRAVIR & RILPIVIRINE ER 400 & 600 MG/2ML IM SUER
1.0000 | Freq: Once | INTRAMUSCULAR | Status: AC
Start: 2020-08-21 — End: 2020-08-21
  Administered 2020-08-21: 1 via INTRAMUSCULAR

## 2020-08-21 MED ORDER — CABOTEGRAVIR & RILPIVIRINE ER 600 & 900 MG/3ML IM SUER
1.0000 | Freq: Once | INTRAMUSCULAR | Status: AC
  Administered 2020-08-21: 1 via INTRAMUSCULAR

## 2020-08-21 NOTE — Addendum Note (Signed)
Addended by: Andree Coss on: 08/21/2020 12:40 PM   Modules accepted: Orders

## 2020-08-21 NOTE — Addendum Note (Signed)
Addended by: Andree Coss on: 08/21/2020 09:58 AM   Modules accepted: Orders

## 2020-08-21 NOTE — Progress Notes (Signed)
Subjective:    Patient ID: Jose Olsen, male    DOB: 03/12/1986, 35 y.o.   MRN: [id number]  CC:  Cabenuva injection - maintenance injection Last injection 4/26   HPI:  Jose Olsen is a 35 y.o. male with well controlled HIV disease here for Q6mCabenuva injection visit.   Tolerating current 161mnterval dose well without any significant side effects. He did not tolerate the volume of the loading injections and would prefer to keep Q1m76m [place]   No Known Allergies    Outpatient Medications Prior to Visit  Medication Sig Dispense Refill  . cabotegravir & rilpivirine ER (CABENUVA) 400 & 600 MG/2ML injection Inject 1 kit into the muscle every 30 (thirty) days. 4 mL 11   No facility-administered medications prior to visit.     Past Medical History:  Diagnosis Date  . Chicken pox   . HIV infection ([place]    Review of Systems  Constitutional: Negative for chills and fever.  HENT: Negative for sore throat.   Eyes: Negative for visual disturbance.  Gastrointestinal: Negative for abdominal pain, anal bleeding and rectal pain.  Genitourinary: Negative for dysuria, genital sores, penile discharge, penile pain, scrotal swelling and testicular pain.  Musculoskeletal: Negative for arthralgias and joint swelling.  Skin: Negative for rash.  Neurological: Negative for headaches.  Hematological: Negative for adenopathy.        Objective:    BP (!) 137/92   Pulse 83   Resp 16   Ht '5\' 10"'  (1.778 m)   Wt 209 lb 8 oz (95 kg)   SpO2 100%   BMI 30.06 kg/m  Nursing note and vital signs reviewed.  Physical Exam Vitals reviewed.  Constitutional:      Appearance: Normal appearance. He is not ill-appearing.  HENT:     Head: Normocephalic.     Mouth/Throat:     Mouth: Mucous membranes are moist.     Pharynx: Oropharynx is clear.  Eyes:     General: No scleral icterus. Pulmonary:     Effort: Pulmonary effort is normal.   Musculoskeletal:        General: Normal range of motion.     Cervical back: Normal range of motion.  Skin:    Coloration: Skin is not jaundiced or pale.  Neurological:     Mental Status: He is alert and oriented to person, place, and time.  Psychiatric:        Mood and Affect: Mood normal.        Judgment: Judgment normal.         Assessment & Plan:   Patient Active Problem List   Diagnosis Date Noted  . Screening for STDs (sexually transmitted diseases) 06/16/2020  . Medication monitoring encounter 11/04/2019  . HTN (hypertension) 05/07/2019  . Screening examination for venereal disease 03/28/2016  . Encounter for Jose-term (current) use of high-risk medication 03/28/2016  . Human immunodeficiency virus (HIV) disease ([place]CC[place]9/13/2012  . ONYCHOMYCOSIS 05/19/2009    Problem List Items Addressed This Visit      Unprioritized   Human immunodeficiency virus (HIV) disease ([place] Primary    Doing well on monthly injections of Cabenuva. Tolerating well with only minor injection site reactions. All injections have been administered at appropriate treatment interval.  The patient will return as expected for maintenance injections.  Will continue with Q2m 61mal load monitoring during first 6 months for therapeutic treatment monitoring. He will schedule  next week for blood work.   Counseled to be cautious regarding rescheduling appointments for medication injections as there is a specific window of time to do his dosing and today is 1 day outside the window. Will continue with close viral load monitoring.   Last VL result:  HIV 1 RNA Quant (Copies/mL)  Date Value  06/16/2020 Not Detected    Dose Interval: Q63mNext Appointment: 09/15/2020       Relevant Orders   HIV-1 RNA quant-no reflex-bld       Follow-up: RTC in 30 days for maintenance injection. Appt has been scheduled.     SJanene Madeira MSN, NP-C RNortheast Endoscopy Centerfor Infectious Disease CBaxter [email or web address] Pager: [phone]: [phone] [phone]

## 2020-08-21 NOTE — Assessment & Plan Note (Addendum)
Doing well on monthly injections of Cabenuva. Tolerating well with only minor injection site reactions. All injections have been administered at appropriate treatment interval.  The patient will return as expected for maintenance injections.  Will continue with Q68m viral load monitoring during first 6 months for therapeutic treatment monitoring. He will schedule next week for blood work.   Counseled to be cautious regarding rescheduling appointments for medication injections as there is a specific window of time to do his dosing and today is 1 day outside the window. Will continue with close viral load monitoring.   Last VL result:  HIV 1 RNA Quant (Copies/mL)  Date Value  06/16/2020 Not Detected    Dose Interval: Q5m Next Appointment: 09/15/2020

## 2020-08-21 NOTE — Patient Instructions (Addendum)
We gave you your injection of CABENUVA for treatment today.    Your next appointment has been scheduled on 09/15/2020 **If you have to reschedule your injection appointment please take caution to know that it has to be no longer than 37 days since the last injection.   Schedule a lab visit at your ability next week to repeat your viral load for monitoring of your new medication.     Helpful Tips:  If you experience any knots under the skin please use a warm compress to help.    Some people experience some redness at the site of the injection. This can be normal and should go away soon.   Moving around today is a good idea, if you sit too much it hurts more.   Please call the office to speak with our pharmacy or triage team if you have any questions or concerns.

## 2020-08-21 NOTE — Progress Notes (Signed)
Patient received Cabenuva 400/600 mg/18ml injections - 1 month dosing, not 2 month dosing. Unable to correct documentation that shows 600/900 mg/44ml dose. Provider aware. Andree Coss, RN

## 2020-08-24 ENCOUNTER — Other Ambulatory Visit: Payer: 59

## 2020-08-24 ENCOUNTER — Other Ambulatory Visit: Payer: Self-pay

## 2020-08-24 DIAGNOSIS — B20 Human immunodeficiency virus [HIV] disease: Secondary | ICD-10-CM

## 2020-08-26 LAB — HIV-1 RNA QUANT-NO REFLEX-BLD
HIV 1 RNA Quant: NOT DETECTED Copies/mL
HIV-1 RNA Quant, Log: NOT DETECTED Log cps/mL

## 2020-08-31 ENCOUNTER — Other Ambulatory Visit: Payer: Self-pay | Admitting: Family

## 2020-08-31 DIAGNOSIS — B20 Human immunodeficiency virus [HIV] disease: Secondary | ICD-10-CM

## 2020-09-10 ENCOUNTER — Telehealth: Payer: Self-pay

## 2020-09-10 NOTE — Telephone Encounter (Signed)
[name] Patient Advocate Encounter  [name] medication [name]) have been delivered to [facility] from [facility] and will be administered on patient next office visit on 09/15/20.  [name] , CPhT Specialty Pharmacy Patient [facility] for Infectious Disease Phone: [phone] Fax:  [phone]

## 2020-09-15 ENCOUNTER — Other Ambulatory Visit: Payer: Self-pay

## 2020-09-15 ENCOUNTER — Encounter: Payer: Self-pay | Admitting: Infectious Diseases

## 2020-09-15 ENCOUNTER — Ambulatory Visit (INDEPENDENT_AMBULATORY_CARE_PROVIDER_SITE_OTHER): Payer: 59 | Admitting: Infectious Diseases

## 2020-09-15 VITALS — BP 122/73 | HR 93 | Temp 98.3°F | Wt 201.0 lb

## 2020-09-15 DIAGNOSIS — Z113 Encounter for screening for infections with a predominantly sexual mode of transmission: Secondary | ICD-10-CM

## 2020-09-15 DIAGNOSIS — Z0000 Encounter for general adult medical examination without abnormal findings: Secondary | ICD-10-CM | POA: Diagnosis not present

## 2020-09-15 DIAGNOSIS — B20 Human immunodeficiency virus [HIV] disease: Secondary | ICD-10-CM | POA: Diagnosis not present

## 2020-09-15 MED ORDER — CABOTEGRAVIR & RILPIVIRINE ER 400 & 600 MG/2ML IM SUER
1.0000 | Freq: Once | INTRAMUSCULAR | Status: AC
  Administered 2020-09-15: 1 via INTRAMUSCULAR

## 2020-09-15 NOTE — Assessment & Plan Note (Signed)
Politely declined need for screening today.

## 2020-09-15 NOTE — Progress Notes (Signed)
   Subjective:    Patient ID: Jose Olsen, male    DOB: [birth year], 35 y.o.   MRN: [id number]  CC:  Cabenuva injection - maintenance injection Last injection 06/06   HPI:  Iliya Olsen is a 35 y.o. male with well controlled HIV disease here for Q55m Cabenuva injection visit.   Tolerating Q4m well without any concern. Just frustrating with pharmacy calling him to verify shipment all the time.  Not interested in the Q92m volume because of increased side effects from injection.     No Known Allergies    Outpatient Medications Prior to Visit  Medication Sig Dispense Refill   cabotegravir & rilpivirine ER (CABENUVA) 400 & 600 MG/2ML injection Inject 1 kit into the muscle every 30 (thirty) days. 4 mL 11   No facility-administered medications prior to visit.     Past Medical History:  Diagnosis Date   Chicken pox    HIV infection ([place])      Review of Systems      Objective:    BP 122/73   Pulse 93   Temp 98.3 F (36.8 C) (Oral)   Wt 201 lb (91.2 kg)   SpO2 98%   BMI 28.84 kg/m  Nursing note and vital signs reviewed.  Physical Exam No exam conducted today with injection visit      Assessment & Plan:   Patient Active Problem List   Diagnosis Date Noted   Healthcare maintenance 09/15/2020   Screening for STDs (sexually transmitted diseases) 06/16/2020   Medication monitoring encounter 11/04/2019   HTN (hypertension) 05/07/2019   Screening examination for venereal disease 03/28/2016   Encounter for long-term (current) use of high-risk medication 03/28/2016   Human immunodeficiency virus (HIV) disease ([place]) 12/02/2010   ONYCHOMYCOSIS 05/19/2009    Problem List Items Addressed This Visit       Unprioritized   Screening for STDs (sexually transmitted diseases)    Politely declined need for screening today.        Human immunodeficiency virus (HIV) disease ([place]) - Primary    Doing well on monthly injections of Cabenuva. Tolerating well with only minor  injection site reactions. Will continue with Q24m viral load monitoring during first 6 months for therapeutic treatment monitoring. Seems to be working well for him with undetectable VLs.   Last VL result:  HIV 1 RNA Quant (Copies/mL)  Date Value  08/24/2020 Not Detected   Dose Interval: Q41m Next Appointment: 10/13/2020       Relevant Medications   cabotegravir & rilpivirine ER (CABENUVA) 400 & 600 MG/2ML injection 1 kit   Healthcare maintenance    Up to date with COVID vaccines  Advised flu shot in September / October          Follow-up: RTC in 30 days for maintenance injection. Appt has been scheduled.     Janene Madeira, MSN, NP-C [facility] for Infectious Disease [email or web address] Pager: [phone] Office: [place]: [phone]

## 2020-09-15 NOTE — Assessment & Plan Note (Signed)
Up to date with COVID vaccines  Advised flu shot in September / October

## 2020-09-15 NOTE — Patient Instructions (Signed)
We gave you your injection of CABENUVA for treatment today.   Your next appointment has been scheduled in 1 month on 10/13/2020    Helpful Tips:  If you experience any knots under the skin please use a warm compress to help.    Some people experience some redness at the site of the injection. This can be normal and should go away soon.   Moving around today is a good idea, if you sit too much it hurts more.   Please call the office to speak with our pharmacy or triage team if you have any questions or concerns.

## 2020-09-15 NOTE — Assessment & Plan Note (Signed)
Doing well on monthly injections of Cabenuva. Tolerating well with only minor injection site reactions. Will continue with Q67m viral load monitoring during first 6 months for therapeutic treatment monitoring. Seems to be working well for him with undetectable VLs.   Last VL result:  HIV 1 RNA Quant (Copies/mL)  Date Value  08/24/2020 Not Detected    Dose Interval: Q88m Next Appointment: 10/13/2020

## 2020-10-07 ENCOUNTER — Telehealth: Payer: Self-pay

## 2020-10-07 NOTE — Telephone Encounter (Signed)
[name] Patient Advocate Encounter  [name] medication (Cabenuva)have been couriered to [name] from [employer] and will be administered on patient next office visit on 10/13/20.  [name] , CPhT Specialty Pharmacy Patient [facility] for Infectious Disease Phone: [phone] Fax:  [phone]

## 2020-10-13 ENCOUNTER — Other Ambulatory Visit: Payer: Self-pay

## 2020-10-13 ENCOUNTER — Ambulatory Visit (INDEPENDENT_AMBULATORY_CARE_PROVIDER_SITE_OTHER): Payer: 59 | Admitting: Internal Medicine

## 2020-10-13 ENCOUNTER — Encounter: Payer: Self-pay | Admitting: Internal Medicine

## 2020-10-13 VITALS — BP 124/84 | HR 73 | Temp 98.3°F | Ht 70.0 in | Wt 202.0 lb

## 2020-10-13 DIAGNOSIS — Z7189 Other specified counseling: Secondary | ICD-10-CM | POA: Insufficient documentation

## 2020-10-13 DIAGNOSIS — B20 Human immunodeficiency virus [HIV] disease: Secondary | ICD-10-CM | POA: Diagnosis not present

## 2020-10-13 MED ORDER — CABOTEGRAVIR & RILPIVIRINE ER 400 & 600 MG/2ML IM SUER
1.0000 | Freq: Once | INTRAMUSCULAR | Status: AC
  Administered 2020-10-13: 1 via INTRAMUSCULAR

## 2020-10-13 NOTE — Assessment & Plan Note (Signed)
Well-controlled, no new issues and will continue with once monthly Cabenuva.

## 2020-10-13 NOTE — Assessment & Plan Note (Signed)
Discussed Cabenuva injections and no concerns. Will continue

## 2020-10-13 NOTE — Progress Notes (Signed)
   Subjective:    Patient ID: Jose Olsen, male    DOB: 1985/07/01, 35 y.o.   MRN: [id number]  HPI He is here for follow up of HIV He is on Cabenuva once monthly and denies any new issues. Last viral load in June and not detected.  Prefers monthly rather than every 2 months due to side effect profile.  No issues with taking monthly.     Review of Systems  Constitutional:  Negative for fatigue.  Gastrointestinal:  Negative for diarrhea and nausea.  Skin:  Negative for rash.      Objective:   Physical Exam Eyes:     General: No scleral icterus. Pulmonary:     Effort: Pulmonary effort is normal.  Neurological:     General: No focal deficit present.     Mental Status: He is alert.  Psychiatric:        Mood and Affect: Mood normal.          Assessment & Plan:

## 2020-11-03 ENCOUNTER — Telehealth: Payer: Self-pay

## 2020-11-03 NOTE — Telephone Encounter (Signed)
[name] Patient Advocate Encounter  [name] medication (Cabenuva)have been couriered to [name] from [employer] and will be administered on patient next office visit on 11/16/20.  [name] , CPhT Specialty Pharmacy Patient [facility] for Infectious Disease Phone: [phone] Fax:  [phone]

## 2020-11-16 ENCOUNTER — Encounter: Payer: 59 | Admitting: Pharmacist

## 2020-11-18 ENCOUNTER — Other Ambulatory Visit: Payer: Self-pay

## 2020-11-18 ENCOUNTER — Ambulatory Visit (INDEPENDENT_AMBULATORY_CARE_PROVIDER_SITE_OTHER): Payer: 59 | Admitting: Pharmacist

## 2020-11-18 DIAGNOSIS — B20 Human immunodeficiency virus [HIV] disease: Secondary | ICD-10-CM | POA: Diagnosis not present

## 2020-11-18 MED ORDER — CABOTEGRAVIR & RILPIVIRINE ER 400 & 600 MG/2ML IM SUER
1.0000 | Freq: Once | INTRAMUSCULAR | Status: AC
  Administered 2020-11-18: 1 via INTRAMUSCULAR

## 2020-11-18 NOTE — Progress Notes (Signed)
HPI: Jose Olsen is a 35 y.o. male who presents to the [place] clinic for [place] administration.  Patient Active Problem List   Diagnosis Date Noted   Injection education, encounter for 10/13/2020   Healthcare maintenance 09/15/2020   Screening for STDs (sexually transmitted diseases) 06/16/2020   Medication monitoring encounter 11/04/2019   HTN (hypertension) 05/07/2019   Screening examination for venereal disease 03/28/2016   Encounter for long-term (current) use of high-risk medication 03/28/2016   Human immunodeficiency virus (HIV) disease ([place]) 12/02/2010   ONYCHOMYCOSIS 05/19/2009    [name] Medications  New Prescriptions   No medications on file  Previous Medications   CABOTEGRAVIR & RILPIVIRINE ER (CABENUVA) 400 & 600 MG/2ML INJECTION    Inject 1 kit into the muscle every 30 (thirty) days.  Modified Medications   No medications on file  Discontinued Medications   No medications on file    Allergies: No Known Allergies  Past Medical History: Past Medical History:  Diagnosis Date   Chicken pox    HIV infection ([place])     Social History: Social History   Socioeconomic History   Marital status: Single    Spouse name: Not on file   Number of children: Not on file   Years of education: Not on file   Highest education level: Not on file  Occupational History   Not on file  Tobacco Use   Smoking status: Never   Smokeless tobacco: Never  Substance and Sexual Activity   Alcohol use: Yes    Comment: rarely   Drug use: No    Frequency: 3.0 times per week   Sexual activity: Yes    Partners: Male    Birth control/protection: Condom    Comment: given condoms  Other Topics Concern   Not on file  Social History Narrative   Occupation:  Unemployed   Never Smoked   Alcohol use-yes   Regular exercise-no   Social Determinants of Health   Financial Resource Strain: Not on file  Food Insecurity: Not on file  Transportation Needs: Not on file   Physical Activity: Not on file  Stress: Not on file  Social Connections: Not on file    Labs: Lab Results  Component Value Date   HIV1RNAQUANT Not Detected 08/24/2020   HIV1RNAQUANT Not Detected 06/16/2020   HIV1RNAQUANT Not Detected 05/19/2020   CD4TABS 631 06/16/2020   CD4TABS 940 05/19/2020   CD4TABS 967 10/25/2019    RPR and STI Lab Results  Component Value Date   LABRPR NON-REACTIVE 06/16/2020   LABRPR NON-REACTIVE 05/07/2019   LABRPR NON-REACTIVE 08/14/2018   LABRPR NON REAC 03/28/2016   LABRPR NON REAC 12/14/2012    STI Results GC GC CT CT  Latest Ref Rng & Units - NEGATIVE - NEGATIVE  06/16/2020 Negative - Negative -  06/16/2020 Negative - Negative -  06/16/2020 Negative - Negative -  05/07/2019 Negative - Negative -  05/07/2019 Negative - Negative -  05/07/2019 Negative - Negative -  08/14/2018 Negative - Negative -  10/10/2013 - POSITIVE(A) - NEGATIVE    Hepatitis B Lab Results  Component Value Date   HEPBSAB NEG 11/11/2010   HEPBSAG NEGATIVE 11/11/2010   Hepatitis C No results found for: HEPCAB, HCVRNAPCRQN Hepatitis A No results found for: HAV Lipids: Lab Results  Component Value Date   CHOL 202 (H) 05/07/2019   TRIG 255 (H) 05/07/2019   HDL 38 (L) 05/07/2019   CHOLHDL 5.3 (H) 05/07/2019   VLDL 19 12/14/2012   [place]  125 (H) 05/07/2019    TARGET DATE: The 26th of the month  Assessment: Abdulloh presents today for their maintenance Cabenuva injections. Past injections were tolerated well without issues. No problems with systemic side effects of injections. Patient did experience some mild pain.   Administered cabotegravir 640m/3mL in left upper outer quadrant of the gluteal muscle. Administered rilpivirine 900 mg/330min the right upper outer quadrant of the gluteal muscle. Monitored patient for 10 minutes after injection. Injections were tolerated well without issue. Patient will follow up in 2 months for next injection.  Plan: - Cabenuva  injections administered - Next injections scheduled for 9/26, 10/24, 11/28, 12/27, 1/24, and 2/27 - Call with any issues or questions  Jasher Barkan L. Samoria Fedorko, PharmD, BCIDP, AAHIVP, CPPopponesset Islandlinical Pharmacist Practitioner InWebbervilleor Infectious Disease

## 2020-12-03 ENCOUNTER — Telehealth: Payer: Self-pay

## 2020-12-03 NOTE — Telephone Encounter (Signed)
[name] Patient Advocate Encounter  [name] medication(Cabenuva) have been couriered to [name] from [facility] and will be administered on patient next office visit on 12/14/20.  [name] , CPhT Specialty Pharmacy Patient [facility] for Infectious Disease Phone: [phone] Fax:  [phone]

## 2020-12-14 ENCOUNTER — Encounter: Payer: 59 | Admitting: Pharmacist

## 2020-12-15 ENCOUNTER — Other Ambulatory Visit: Payer: Self-pay

## 2020-12-15 ENCOUNTER — Ambulatory Visit (INDEPENDENT_AMBULATORY_CARE_PROVIDER_SITE_OTHER): Payer: 59 | Admitting: Pharmacist

## 2020-12-15 DIAGNOSIS — Z23 Encounter for immunization: Secondary | ICD-10-CM | POA: Diagnosis not present

## 2020-12-15 DIAGNOSIS — B20 Human immunodeficiency virus [HIV] disease: Secondary | ICD-10-CM

## 2020-12-15 DIAGNOSIS — Z113 Encounter for screening for infections with a predominantly sexual mode of transmission: Secondary | ICD-10-CM

## 2020-12-15 MED ORDER — CABOTEGRAVIR & RILPIVIRINE ER 400 & 600 MG/2ML IM SUER
1.0000 | Freq: Once | INTRAMUSCULAR | Status: AC
  Administered 2020-12-15: 1 via INTRAMUSCULAR

## 2020-12-15 NOTE — Progress Notes (Signed)
12/15/2020  HPI: Jose Olsen is a 35 y.o. male who presents to the [place] clinic for HIV follow-up.  Patient Active Problem List   Diagnosis Date Noted   Injection education, encounter for 10/13/2020   Healthcare maintenance 09/15/2020   Screening for STDs (sexually transmitted diseases) 06/16/2020   Medication monitoring encounter 11/04/2019   HTN (hypertension) 05/07/2019   Screening examination for venereal disease 03/28/2016   Encounter for long-term (current) use of high-risk medication 03/28/2016   Human immunodeficiency virus (HIV) disease ([place]) 12/02/2010   ONYCHOMYCOSIS 05/19/2009    [name] Medications  New Prescriptions   No medications on file  Previous Medications   CABOTEGRAVIR & RILPIVIRINE ER (CABENUVA) 400 & 600 MG/2ML INJECTION    Inject 1 kit into the muscle every 30 (thirty) days.  Modified Medications   No medications on file  Discontinued Medications   No medications on file    Allergies: No Known Allergies  Past Medical History: Past Medical History:  Diagnosis Date   Chicken pox    HIV infection ([place])     Social History: Social History   Socioeconomic History   Marital status: Single    Spouse name: Not on file   Number of children: Not on file   Years of education: Not on file   Highest education level: Not on file  Occupational History   Not on file  Tobacco Use   Smoking status: Never   Smokeless tobacco: Never  Substance and Sexual Activity   Alcohol use: Yes    Comment: rarely   Drug use: No    Frequency: 3.0 times per week   Sexual activity: Yes    Partners: Male    Birth control/protection: Condom    Comment: given condoms  Other Topics Concern   Not on file  Social History Narrative   Occupation:  Unemployed   Never Smoked   Alcohol use-yes   Regular exercise-no   Social Determinants of Health   Financial Resource Strain: Not on file  Food Insecurity: Not on file  Transportation Needs: Not on file   Physical Activity: Not on file  Stress: Not on file  Social Connections: Not on file    Labs: Lab Results  Component Value Date   HIV1RNAQUANT Not Detected 08/24/2020   HIV1RNAQUANT Not Detected 06/16/2020   HIV1RNAQUANT Not Detected 05/19/2020   CD4TABS 631 06/16/2020   CD4TABS 940 05/19/2020   CD4TABS 967 10/25/2019    RPR and STI Lab Results  Component Value Date   LABRPR NON-REACTIVE 06/16/2020   LABRPR NON-REACTIVE 05/07/2019   LABRPR NON-REACTIVE 08/14/2018   LABRPR NON REAC 03/28/2016   LABRPR NON REAC 12/14/2012    STI Results GC GC CT CT  Latest Ref Rng & Units - NEGATIVE - NEGATIVE  06/16/2020 Negative - Negative -  06/16/2020 Negative - Negative -  06/16/2020 Negative - Negative -  05/07/2019 Negative - Negative -  05/07/2019 Negative - Negative -  05/07/2019 Negative - Negative -  08/14/2018 Negative - Negative -  10/10/2013 - POSITIVE(A) - NEGATIVE    Hepatitis B Lab Results  Component Value Date   HEPBSAB NEG 11/11/2010   HEPBSAG NEGATIVE 11/11/2010   Hepatitis C No results found for: HEPCAB, HCVRNAPCRQN Hepatitis A No results found for: HAV Lipids: Lab Results  Component Value Date   CHOL 202 (H) 05/07/2019   TRIG 255 (H) 05/07/2019   HDL 38 (L) 05/07/2019   CHOLHDL 5.3 (H) 05/07/2019   VLDL 19 12/14/2012  LDLCALC 125 (H) 05/07/2019    Current HIV Regimen: Cabenuva 467m/600mg (monthly formulation)   TARGET DATE: The 26th of the month   Assessment: Patient presents to clinic today for his monthly Cabenuva injection. Patient expressed interest in receiving Cabenuva injections every 2 months because he stated the monthly injections are rather painful, but pain has decreased with each injection. Counseled the patient that if he opted to start receiving Cabenuva every 2 months, he would likely start to experience pain with injections again as there is more volume in the Q258monthnjections, but explained this would also likely decrease  with  subsequent injections just as it did with his monthly injections. He stated he would stick with monthly injections for now.   Patient also opted to receive the influenza vaccine in clinic today. Patient declined STI screening at this visit.   Plan: - Cabenuva 400 mg/600 mg IM x1 (f/u in 1 month for next injection)   - F/U HIV RNA, CD4, Hep A/B/C antibodies  - Influena vaccine IM x1    AuAdria DillPharmD PGY-1 Acute Care Resident  12/15/2020 9:33 AM

## 2020-12-16 ENCOUNTER — Other Ambulatory Visit: Payer: Self-pay

## 2020-12-16 ENCOUNTER — Ambulatory Visit (INDEPENDENT_AMBULATORY_CARE_PROVIDER_SITE_OTHER): Payer: 59

## 2020-12-16 ENCOUNTER — Other Ambulatory Visit: Payer: 59

## 2020-12-16 DIAGNOSIS — Z23 Encounter for immunization: Secondary | ICD-10-CM

## 2020-12-16 DIAGNOSIS — Z113 Encounter for screening for infections with a predominantly sexual mode of transmission: Secondary | ICD-10-CM

## 2020-12-16 DIAGNOSIS — B20 Human immunodeficiency virus [HIV] disease: Secondary | ICD-10-CM

## 2020-12-16 NOTE — Progress Notes (Signed)
   Covid-19 Vaccination Clinic  Name:  Bunnie Lederman    MRN: [id number] DOB: May 01, 1985  12/16/2020  Mr. Stangl declined to be observed post Covid-19 immunization.   Mr. Steffenhagen was instructed to call 911 with any severe reactions post vaccine: Difficulty breathing  Swelling of face and throat  A fast heartbeat  A bad rash all over body  Dizziness and weakness     Rosanna Randy, RN

## 2020-12-17 ENCOUNTER — Encounter: Payer: Self-pay | Admitting: Family

## 2020-12-17 LAB — T-HELPER CELLS (CD4) COUNT (NOT AT ARMC)
CD4 % Helper T Cell: 37 % (ref 33–65)
CD4 T Cell Abs: 655 /uL (ref 400–1790)

## 2020-12-19 LAB — RPR: RPR Ser Ql: NONREACTIVE

## 2020-12-19 LAB — HIV-1 RNA QUANT-NO REFLEX-BLD
HIV 1 RNA Quant: NOT DETECTED Copies/mL
HIV-1 RNA Quant, Log: NOT DETECTED Log cps/mL

## 2020-12-19 LAB — HEPATITIS A ANTIBODY, TOTAL: Hepatitis A AB,Total: REACTIVE — AB

## 2020-12-19 LAB — HEPATITIS B SURFACE ANTIBODY,QUALITATIVE: Hep B S Ab: REACTIVE — AB

## 2020-12-19 LAB — HEPATITIS C ANTIBODY
Hepatitis C Ab: NONREACTIVE
SIGNAL TO CUT-OFF: 0.02 (ref <1.00)

## 2021-01-01 ENCOUNTER — Telehealth: Payer: Self-pay

## 2021-01-01 NOTE — Telephone Encounter (Signed)
[name] Patient Advocate Encounter  [name] medication(Cabenuva) have been couriered to [name] from [employer] and will be administered on patient next office visit on 01/11/21.  [name] , CPhT Specialty Pharmacy Patient [facility] for Infectious Disease Phone: [phone] Fax:  [phone]

## 2021-01-11 ENCOUNTER — Ambulatory Visit (INDEPENDENT_AMBULATORY_CARE_PROVIDER_SITE_OTHER): Payer: 59 | Admitting: Pharmacist

## 2021-01-11 ENCOUNTER — Other Ambulatory Visit: Payer: Self-pay

## 2021-01-11 DIAGNOSIS — B20 Human immunodeficiency virus [HIV] disease: Secondary | ICD-10-CM | POA: Diagnosis not present

## 2021-01-11 MED ORDER — CABOTEGRAVIR & RILPIVIRINE ER 400 & 600 MG/2ML IM SUER
1.0000 | Freq: Once | INTRAMUSCULAR | Status: AC
  Administered 2021-01-11: 1 via INTRAMUSCULAR

## 2021-01-11 NOTE — Progress Notes (Signed)
HPI: Jose Olsen is a 35 y.o. male who presents to the [place] clinic for [place] administration.  Patient Active Problem List   Diagnosis Date Noted   Injection education, encounter for 10/13/2020   Healthcare maintenance 09/15/2020   Screening for STDs (sexually transmitted diseases) 06/16/2020   Medication monitoring encounter 11/04/2019   HTN (hypertension) 05/07/2019   Screening examination for venereal disease 03/28/2016   Encounter for long-term (current) use of high-risk medication 03/28/2016   Human immunodeficiency virus (HIV) disease (Jose Olsen) 12/02/2010   ONYCHOMYCOSIS 05/19/2009    [name] Medications  New Prescriptions   No medications on file  Previous Medications   CABOTEGRAVIR & RILPIVIRINE ER (CABENUVA) 400 & 600 MG/2ML INJECTION    Inject 1 kit into the muscle every 30 (thirty) days.  Modified Medications   No medications on file  Discontinued Medications   No medications on file    Allergies: No Known Allergies  Past Medical History: Past Medical History:  Diagnosis Date   Chicken pox    HIV infection ([place])     Social History: Social History   Socioeconomic History   Marital status: Single    Spouse name: Not on file   Number of children: Not on file   Years of education: Not on file   Highest education level: Not on file  Occupational History   Not on file  Tobacco Use   Smoking status: Never   Smokeless tobacco: Never  Substance and Sexual Activity   Alcohol use: Yes    Comment: rarely   Drug use: No    Frequency: 3.0 times per week   Sexual activity: Yes    Partners: Male    Birth control/protection: Condom    Comment: given condoms  Other Topics Concern   Not on file  Social History Narrative   Occupation:  Unemployed   Never Smoked   Alcohol use-yes   Regular exercise-no   Social Determinants of Health   Financial Resource Strain: Not on file  Food Insecurity: Not on file  Transportation Needs: Not on file   Physical Activity: Not on file  Stress: Not on file  Social Connections: Not on file    Labs: Lab Results  Component Value Date   HIV1RNAQUANT Not Detected 12/16/2020   HIV1RNAQUANT Not Detected 08/24/2020   HIV1RNAQUANT Not Detected 06/16/2020   CD4TABS 655 12/16/2020   CD4TABS 631 06/16/2020   CD4TABS 940 05/19/2020    RPR and STI Lab Results  Component Value Date   LABRPR NON-REACTIVE 12/16/2020   LABRPR NON-REACTIVE 06/16/2020   LABRPR NON-REACTIVE 05/07/2019   LABRPR NON-REACTIVE 08/14/2018   LABRPR NON REAC 03/28/2016    STI Results GC GC CT CT  Latest Ref Rng & Units - NEGATIVE - NEGATIVE  06/16/2020 Negative - Negative -  06/16/2020 Negative - Negative -  06/16/2020 Negative - Negative -  05/07/2019 Negative - Negative -  05/07/2019 Negative - Negative -  05/07/2019 Negative - Negative -  08/14/2018 Negative - Negative -  10/10/2013 - POSITIVE(A) - NEGATIVE    Hepatitis B Lab Results  Component Value Date   HEPBSAB REACTIVE (A) 12/16/2020   HEPBSAG NEGATIVE 11/11/2010   Hepatitis C Lab Results  Component Value Date   HEPCAB NON-REACTIVE 12/16/2020   Hepatitis A Lab Results  Component Value Date   HAV REACTIVE (A) 12/16/2020   Lipids: Lab Results  Component Value Date   CHOL 202 (H) 05/07/2019   TRIG 255 (H) 05/07/2019   HDL 38 (  L) 05/07/2019   CHOLHDL 5.3 (H) 05/07/2019   VLDL 19 12/14/2012   LDLCALC 125 (H) 05/07/2019    TARGET DATE: 26th of the month  Assessment: [place] presents today for their maintenance Cabenuva injections. Past injections were tolerated well without issues. No problems with systemic side effects of injections.  Administered cabotegravir 400mg /54mL in left upper outer quadrant of the gluteal muscle. Administered rilpivirine 600 mg/51mL in the right upper outer quadrant of the gluteal muscle. Monitored patient for 10 minutes after injection. Injections were tolerated well without issue. Patient will follow up in 2 months for  next injection.  Plan: - Cabenuva injections administered - Next injections scheduled for 11/28 & 12/19 with [name], 1/24 with Dr. Linus Salmons, 2/27 with [name] - Call with any issues or questions   Rebbeca Paul, PharmD PGY2 Ambulatory Care Pharmacy Resident 01/11/2021 8:56 AM

## 2021-01-28 ENCOUNTER — Telehealth: Payer: Self-pay

## 2021-01-28 NOTE — Telephone Encounter (Signed)
[name] Patient Advocate Encounter  [name] medication (Cabenuva)have been couriered to [name] from [employer] and will be administered on patient next office visit on 02/15/21.  [name] , CPhT Specialty Pharmacy Patient [facility] for Infectious Disease Phone: [phone] Fax:  [phone]

## 2021-02-15 ENCOUNTER — Other Ambulatory Visit (HOSPITAL_COMMUNITY)
Admission: RE | Admit: 2021-02-15 | Discharge: 2021-02-15 | Disposition: A | Discharge status: Home / Self Care | Payer: 59 | Source: Ambulatory Visit | Attending: Infectious Disease | Admitting: Infectious Disease

## 2021-02-15 ENCOUNTER — Other Ambulatory Visit: Payer: Self-pay

## 2021-02-15 ENCOUNTER — Ambulatory Visit (INDEPENDENT_AMBULATORY_CARE_PROVIDER_SITE_OTHER): Payer: 59 | Admitting: Pharmacist

## 2021-02-15 DIAGNOSIS — B20 Human immunodeficiency virus [HIV] disease: Secondary | ICD-10-CM | POA: Insufficient documentation

## 2021-02-15 MED ORDER — CABOTEGRAVIR & RILPIVIRINE ER 400 & 600 MG/2ML IM SUER
1.0000 | Freq: Once | INTRAMUSCULAR | Status: AC
  Administered 2021-02-15: 10:00:00 1 via INTRAMUSCULAR

## 2021-02-15 NOTE — Progress Notes (Addendum)
HPI: Jose Olsen is a 35 y.o. male who presents to the [facility] for [place] administration.  Patient Active Problem List   Diagnosis Date Noted   Injection education, encounter for 10/13/2020   Healthcare maintenance 09/15/2020   Screening for STDs (sexually transmitted diseases) 06/16/2020   Medication monitoring encounter 11/04/2019   HTN (hypertension) 05/07/2019   Screening examination for venereal disease 03/28/2016   Encounter for long-term (current) use of high-risk medication 03/28/2016   Human immunodeficiency virus (HIV) disease ([name]) 12/02/2010   ONYCHOMYCOSIS 05/19/2009    [name] Medications  New Prescriptions   No medications on file  Previous Medications   CABOTEGRAVIR & RILPIVIRINE ER (CABENUVA) 400 & 600 MG/2ML INJECTION    Inject 1 kit into the muscle every 30 (thirty) days.  Modified Medications   No medications on file  Discontinued Medications   No medications on file    Allergies: No Known Allergies  Past Medical History: Past Medical History:  Diagnosis Date   Chicken pox    HIV infection ([place])     Social History: Social History   Socioeconomic History   Marital status: Single    Spouse name: Not on file   Number of children: Not on file   Years of education: Not on file   Highest education level: Not on file  Occupational History   Not on file  Tobacco Use   Smoking status: Never   Smokeless tobacco: Never  Substance and Sexual Activity   Alcohol use: Yes    Comment: rarely   Drug use: No    Frequency: 3.0 times per week   Sexual activity: Yes    Partners: Male    Birth control/protection: Condom    Comment: given condoms  Other Topics Concern   Not on file  Social History Narrative   Occupation:  Unemployed   Never Smoked   Alcohol use-yes   Regular exercise-no   Social Determinants of Health   Financial Resource Strain: Not on file  Food Insecurity: Not on file  Transportation Needs: Not on file   Physical Activity: Not on file  Stress: Not on file  Social Connections: Not on file    Labs: Lab Results  Component Value Date   HIV1RNAQUANT Not Detected 12/16/2020   HIV1RNAQUANT Not Detected 08/24/2020   HIV1RNAQUANT Not Detected 06/16/2020   CD4TABS 655 12/16/2020   CD4TABS 631 06/16/2020   CD4TABS 940 05/19/2020    RPR and STI Lab Results  Component Value Date   LABRPR NON-REACTIVE 12/16/2020   LABRPR NON-REACTIVE 06/16/2020   LABRPR NON-REACTIVE 05/07/2019   LABRPR NON-REACTIVE 08/14/2018   LABRPR NON REAC 03/28/2016    STI Results GC GC CT CT  Latest Ref Rng & Units - NEGATIVE - NEGATIVE  06/16/2020 Negative - Negative -  06/16/2020 Negative - Negative -  06/16/2020 Negative - Negative -  05/07/2019 Negative - Negative -  05/07/2019 Negative - Negative -  05/07/2019 Negative - Negative -  08/14/2018 Negative - Negative -  10/10/2013 - POSITIVE(A) - NEGATIVE    Hepatitis B Lab Results  Component Value Date   HEPBSAB REACTIVE (A) 12/16/2020   HEPBSAG NEGATIVE 11/11/2010   Hepatitis C Lab Results  Component Value Date   HEPCAB NON-REACTIVE 12/16/2020   Hepatitis A Lab Results  Component Value Date   HAV REACTIVE (A) 12/16/2020   Lipids: Lab Results  Component Value Date   CHOL 202 (H) 05/07/2019   TRIG 255 (H) 05/07/2019   HDL 38 (  L) 05/07/2019   CHOLHDL 5.3 (H) 05/07/2019   VLDL 19 12/14/2012   LDLCALC 125 (H) 05/07/2019    TARGET DATE:  The 26th of the month  Current HIV Regimen: Cabenuva  Assessment: Jose Olsen presents today for their maintenance Cabenuva injections. Initial/past injections were tolerated well without issues. No problems with systemic effects of injections. Patient did experience mild soreness in the beginning of the injections, but states that he no longer gets soreness with the injections. He is still with his same partner, but would like to do a urine cytology for STI screening today.  Administered cabotegravir 476m/2mL in  left upper outer quadrant of the gluteal muscle. Administered rilpivirine 600 mg/238min the right upper outer quadrant of the gluteal muscle. Monitored patient for 10 minutes after injection. Injections were tolerated well without issue. Patient will follow up monthly for next injections.   Plan: - Cabenuva injections administered - Next injections scheduled for 03/08/21, 04/13/21, 05/17/21, 06/14/21, and 07/12/21 - Call with any issues or questions  AlLestine BoxPharmD PGY2 Infectious Diseases Pharmacy Resident

## 2021-02-16 LAB — HIV-1 RNA QUANT-NO REFLEX-BLD
HIV 1 RNA Quant: NOT DETECTED Copies/mL
HIV-1 RNA Quant, Log: NOT DETECTED Log cps/mL

## 2021-02-16 LAB — URINE CYTOLOGY ANCILLARY ONLY
Chlamydia: NEGATIVE
Comment: NEGATIVE
Comment: NORMAL
Neisseria Gonorrhea: NEGATIVE

## 2021-03-03 ENCOUNTER — Telehealth: Payer: Self-pay

## 2021-03-03 NOTE — Telephone Encounter (Signed)
[name] Patient Advocate Encounter  [name] medication(Cabenuva) have been couriered to [name] from [employer] and will be administered on the patient next office visit on 03/08/21.  [name] , CPhT Specialty Pharmacy Patient [facility] for Infectious Disease Phone: [phone] Fax:  [phone]

## 2021-03-08 ENCOUNTER — Encounter: Payer: 59 | Admitting: Pharmacist

## 2021-03-10 ENCOUNTER — Encounter: Payer: 59 | Admitting: Pharmacist

## 2021-03-16 ENCOUNTER — Encounter: Payer: 59 | Admitting: Pharmacist

## 2021-03-18 ENCOUNTER — Ambulatory Visit (INDEPENDENT_AMBULATORY_CARE_PROVIDER_SITE_OTHER): Payer: 59 | Admitting: Pharmacist

## 2021-03-18 ENCOUNTER — Other Ambulatory Visit: Payer: Self-pay

## 2021-03-18 DIAGNOSIS — B20 Human immunodeficiency virus [HIV] disease: Secondary | ICD-10-CM | POA: Diagnosis not present

## 2021-03-18 MED ORDER — CABOTEGRAVIR & RILPIVIRINE ER 400 & 600 MG/2ML IM SUER
1.0000 | Freq: Once | INTRAMUSCULAR | Status: AC
  Administered 2021-03-18: 10:00:00 1 via INTRAMUSCULAR

## 2021-03-18 NOTE — Progress Notes (Signed)
HPI: Jose Olsen is a 35 y.o. male who presents to the [facility] clinic for [place] administration.  Patient Active Problem List   Diagnosis Date Noted   Injection education, encounter for 10/13/2020   Healthcare maintenance 09/15/2020   Screening for STDs (sexually transmitted diseases) 06/16/2020   Medication monitoring encounter 11/04/2019   HTN (hypertension) 05/07/2019   Screening examination for venereal disease 03/28/2016   Encounter for long-term (current) use of high-risk medication 03/28/2016   Human immunodeficiency virus (HIV) disease ([place]) 12/02/2010   ONYCHOMYCOSIS 05/19/2009    [name] Medications  New Prescriptions   No medications on file  Previous Medications   CABOTEGRAVIR & RILPIVIRINE ER (CABENUVA) 400 & 600 MG/2ML INJECTION    Inject 1 kit into the muscle every 30 (thirty) days.  Modified Medications   No medications on file  Discontinued Medications   No medications on file    Allergies: No Known Allergies  Past Medical History: Past Medical History:  Diagnosis Date   Chicken pox    HIV infection ([place])     Social History: Social History   Socioeconomic History   Marital status: Single    Spouse name: Not on file   Number of children: Not on file   Years of education: Not on file   Highest education level: Not on file  Occupational History   Not on file  Tobacco Use   Smoking status: Never   Smokeless tobacco: Never  Substance and Sexual Activity   Alcohol use: Yes    Comment: rarely   Drug use: No    Frequency: 3.0 times per week   Sexual activity: Yes    Partners: Male    Birth control/protection: Condom    Comment: given condoms  Other Topics Concern   Not on file  Social History Narrative   Occupation:  Unemployed   Never Smoked   Alcohol use-yes   Regular exercise-no   Social Determinants of Health   Financial Resource Strain: Not on file  Food Insecurity: Not on file  Transportation Needs: Not on file   Physical Activity: Not on file  Stress: Not on file  Social Connections: Not on file    Labs: Lab Results  Component Value Date   HIV1RNAQUANT Not Detected 02/15/2021   HIV1RNAQUANT Not Detected 12/16/2020   HIV1RNAQUANT Not Detected 08/24/2020   CD4TABS 655 12/16/2020   CD4TABS 631 06/16/2020   CD4TABS 940 05/19/2020    RPR and STI Lab Results  Component Value Date   LABRPR NON-REACTIVE 12/16/2020   LABRPR NON-REACTIVE 06/16/2020   LABRPR NON-REACTIVE 05/07/2019   LABRPR NON-REACTIVE 08/14/2018   LABRPR NON REAC 03/28/2016    STI Results GC GC CT CT  Latest Ref Rng & Units - NEGATIVE - NEGATIVE  02/15/2021 Negative - Negative -  06/16/2020 Negative - Negative -  06/16/2020 Negative - Negative -  06/16/2020 Negative - Negative -  05/07/2019 Negative - Negative -  05/07/2019 Negative - Negative -  05/07/2019 Negative - Negative -  08/14/2018 Negative - Negative -  10/10/2013 - POSITIVE(A) - NEGATIVE    Hepatitis B Lab Results  Component Value Date   HEPBSAB REACTIVE (A) 12/16/2020   HEPBSAG NEGATIVE 11/11/2010   Hepatitis C Lab Results  Component Value Date   HEPCAB NON-REACTIVE 12/16/2020   Hepatitis A Lab Results  Component Value Date   HAV REACTIVE (A) 12/16/2020   Lipids: Lab Results  Component Value Date   CHOL 202 (H) 05/07/2019   TRIG 255 (  H) 05/07/2019   HDL 38 (L) 05/07/2019   CHOLHDL 5.3 (H) 05/07/2019   VLDL 19 12/14/2012   LDLCALC 125 (H) 05/07/2019    TARGET DATE: The 26th of the month  Assessment: Kreston presents today for their maintenance Cabenuva injections. Initial/past injections were tolerated well without issues. No problems with systemic side effects of injections.   Administered cabotegravir 400mg /48mL in left upper outer quadrant of the gluteal muscle. Administered rilpivirine 600 mg/40mL in the right upper outer quadrant of the gluteal muscle. Monitored patient for 10 minutes after injection. Injections were tolerated well without  issue. Patient will follow up in 1 month for next set of injections.  Plan: - Cabenuva injections administered - Next injections scheduled for 04/13/21 with Dr. Linus Salmons, 05/17/21 with me, 06/14/21 with me, and 07/12/21 with me - Call with any issues or questions  Kathrene Sinopoli L. Tailor Westfall, PharmD, BCIDP, AAHIVP, [place] Clinical Pharmacist Practitioner [facility] for Infectious Disease

## 2021-04-05 ENCOUNTER — Other Ambulatory Visit (HOSPITAL_COMMUNITY): Payer: Self-pay

## 2021-04-06 ENCOUNTER — Other Ambulatory Visit (HOSPITAL_COMMUNITY): Payer: Self-pay

## 2021-04-07 ENCOUNTER — Telehealth: Payer: Self-pay

## 2021-04-07 NOTE — Telephone Encounter (Signed)
[name] Patient Advocate Encounter  Completed and sent [employer]  application for Cabenuva  for this patient who is uninsured.    Patient is approved 04/06/21 through 04/05/22.  Medication will be shipped to the clinic from [facility].   [name], CPhT Specialty Pharmacy Patient [facility] for Infectious Disease Phone: [phone] Fax:  [phone]

## 2021-04-09 ENCOUNTER — Telehealth: Payer: Self-pay

## 2021-04-09 NOTE — Telephone Encounter (Signed)
[name] Patient Advocate Encounter  [name] medication(Cabenuva 400& 600mg ) have been couriered to [name] from and will be administered on patient next office visit on 04/13/21.  04/15/21 , CPhT Specialty Pharmacy Patient [facility] for Infectious Disease Phone: [phone] Fax:  [phone]

## 2021-04-12 ENCOUNTER — Encounter: Payer: Self-pay | Admitting: Internal Medicine

## 2021-04-13 ENCOUNTER — Encounter: Payer: 59 | Admitting: Internal Medicine

## 2021-04-15 ENCOUNTER — Other Ambulatory Visit: Payer: Self-pay

## 2021-04-15 ENCOUNTER — Ambulatory Visit (INDEPENDENT_AMBULATORY_CARE_PROVIDER_SITE_OTHER): Payer: Managed Care, Other (non HMO) | Admitting: Pharmacist

## 2021-04-15 DIAGNOSIS — B20 Human immunodeficiency virus [HIV] disease: Secondary | ICD-10-CM | POA: Diagnosis not present

## 2021-04-15 MED ORDER — CABOTEGRAVIR & RILPIVIRINE ER 400 & 600 MG/2ML IM SUER
1.0000 | Freq: Once | INTRAMUSCULAR | Status: AC
  Administered 2021-04-15: 1 via INTRAMUSCULAR

## 2021-04-15 NOTE — Progress Notes (Signed)
HPI: Jose Olsen is a 36 y.o. male who presents to the [facility] for [place] administration.  Patient Active Problem List   Diagnosis Date Noted   Injection education, encounter for 10/13/2020   Healthcare maintenance 09/15/2020   Screening for STDs (sexually transmitted diseases) 06/16/2020   Medication monitoring encounter 11/04/2019   HTN (hypertension) 05/07/2019   Screening examination for venereal disease 03/28/2016   Encounter for long-term (current) use of high-risk medication 03/28/2016   Human immunodeficiency virus (HIV) disease ([place]) 12/02/2010   ONYCHOMYCOSIS 05/19/2009    [name] Medications  New Prescriptions   No medications on file  Previous Medications   CABOTEGRAVIR & RILPIVIRINE ER (CABENUVA) 400 & 600 MG/2ML INJECTION    Inject 1 kit into the muscle every 30 (thirty) days.  Modified Medications   No medications on file  Discontinued Medications   No medications on file    Allergies: No Known Allergies  Past Medical History: Past Medical History:  Diagnosis Date   Chicken pox    HIV infection ([place])     Social History: Social History   Socioeconomic History   Marital status: Single    Spouse name: Not on file   Number of children: Not on file   Years of education: Not on file   Highest education level: Not on file  Occupational History   Not on file  Tobacco Use   Smoking status: Never   Smokeless tobacco: Never  Substance and Sexual Activity   Alcohol use: Yes    Comment: rarely   Drug use: No    Frequency: 3.0 times per week   Sexual activity: Yes    Partners: Male    Birth control/protection: Condom    Comment: given condoms  Other Topics Concern   Not on file  Social History Narrative   Occupation:  Unemployed   Never Smoked   Alcohol use-yes   Regular exercise-no   Social Determinants of Health   Financial Resource Strain: Not on file  Food Insecurity: Not on file  Transportation Needs: Not on file   Physical Activity: Not on file  Stress: Not on file  Social Connections: Not on file    Labs: Lab Results  Component Value Date   HIV1RNAQUANT Not Detected 02/15/2021   HIV1RNAQUANT Not Detected 12/16/2020   HIV1RNAQUANT Not Detected 08/24/2020   CD4TABS 655 12/16/2020   CD4TABS 631 06/16/2020   CD4TABS 940 05/19/2020    RPR and STI Lab Results  Component Value Date   LABRPR NON-REACTIVE 12/16/2020   LABRPR NON-REACTIVE 06/16/2020   LABRPR NON-REACTIVE 05/07/2019   LABRPR NON-REACTIVE 08/14/2018   LABRPR NON REAC 03/28/2016    STI Results GC GC CT CT  Latest Ref Rng & Units - NEGATIVE - NEGATIVE  02/15/2021 Negative - Negative -  06/16/2020 Negative - Negative -  06/16/2020 Negative - Negative -  06/16/2020 Negative - Negative -  05/07/2019 Negative - Negative -  05/07/2019 Negative - Negative -  05/07/2019 Negative - Negative -  08/14/2018 Negative - Negative -  10/10/2013 - POSITIVE(A) - NEGATIVE    Hepatitis B Lab Results  Component Value Date   HEPBSAB REACTIVE (A) 12/16/2020   HEPBSAG NEGATIVE 11/11/2010   Hepatitis C Lab Results  Component Value Date   HEPCAB NON-REACTIVE 12/16/2020   Hepatitis A Lab Results  Component Value Date   HAV REACTIVE (A) 12/16/2020   Lipids: Lab Results  Component Value Date   CHOL 202 (H) 05/07/2019   TRIG 255 (  H) 05/07/2019   HDL 38 (L) 05/07/2019   CHOLHDL 5.3 (H) 05/07/2019   VLDL 19 12/14/2012   LDLCALC 125 (H) 05/07/2019    TARGET DATE:  The 26th of the month  Current HIV Regimen: Monthly Cabenuva injections (blue box)  Assessment: Quint presents today for their maintenance Cabenuva injections. Initial/past injections were tolerated well without issues. No problems with systemic effects of injections. Patient did experience mild soreness after injections that quickly subsided.   Administered cabotegravir 697m/3mL in left upper outer quadrant of the gluteal muscle. Administered rilpivirine 900 mg/382min the  right upper outer quadrant of the gluteal muscle. Monitored patient for 10 minutes after injection. Injections were tolerated well without issue. Patient will follow up in 2 months for next injection.  No labs were drawn today as the patient has been well-controlled on [place] over 6 months.  Plan: - Cabenuva injections administered - Next injections scheduled for 2/27, 3/27, and 4/24 with [name]; 5/23 with Dr. CoLinus Salmons Call with any issues or questions  KiDonald PorePharmD Pharmacy Resident 04/15/2021, 11:27 AM

## 2021-05-14 ENCOUNTER — Telehealth: Payer: Self-pay

## 2021-05-14 NOTE — Telephone Encounter (Signed)
[name] Patient Advocate Encounter  [name] medication (Cabenuva 400 &600mg ) have been couriered to [name] from [employer] and will be administered on the patient next office visit on 05/17/21.  [name] , CPhT Specialty Pharmacy Patient [facility] for Infectious Disease Phone: [phone] Fax:  [phone]

## 2021-05-17 ENCOUNTER — Encounter: Payer: Managed Care, Other (non HMO) | Admitting: Pharmacist

## 2021-05-17 ENCOUNTER — Other Ambulatory Visit: Payer: Self-pay

## 2021-05-17 ENCOUNTER — Ambulatory Visit (INDEPENDENT_AMBULATORY_CARE_PROVIDER_SITE_OTHER): Payer: Managed Care, Other (non HMO) | Admitting: Pharmacist

## 2021-05-17 DIAGNOSIS — B20 Human immunodeficiency virus [HIV] disease: Secondary | ICD-10-CM | POA: Diagnosis not present

## 2021-05-17 MED ORDER — CABOTEGRAVIR & RILPIVIRINE ER 400 & 600 MG/2ML IM SUER
1.0000 | Freq: Once | INTRAMUSCULAR | Status: AC
  Administered 2021-05-17: 1 via INTRAMUSCULAR

## 2021-05-17 NOTE — Progress Notes (Signed)
HPI: Jose Olsen is a 36 y.o. male who presents to the [facility] for [place] administration.  Patient Active Problem List   Diagnosis Date Noted   Injection education, encounter for 10/13/2020   Healthcare maintenance 09/15/2020   Screening for STDs (sexually transmitted diseases) 06/16/2020   Medication monitoring encounter 11/04/2019   HTN (hypertension) 05/07/2019   Screening examination for venereal disease 03/28/2016   Encounter for long-term (current) use of high-risk medication 03/28/2016   Human immunodeficiency virus (HIV) disease ([place]) 12/02/2010   ONYCHOMYCOSIS 05/19/2009    [name] Medications  New Prescriptions   No medications on file  Previous Medications   CABOTEGRAVIR & RILPIVIRINE ER (CABENUVA) 400 & 600 MG/2ML INJECTION    Inject 1 kit into the muscle every 30 (thirty) days.  Modified Medications   No medications on file  Discontinued Medications   No medications on file    Allergies: No Known Allergies  Past Medical History: Past Medical History:  Diagnosis Date   Chicken pox    HIV infection ([place])     Social History: Social History   Socioeconomic History   Marital status: Single    Spouse name: Not on file   Number of children: Not on file   Years of education: Not on file   Highest education level: Not on file  Occupational History   Not on file  Tobacco Use   Smoking status: Never   Smokeless tobacco: Never  Substance and Sexual Activity   Alcohol use: Yes    Comment: rarely   Drug use: No    Frequency: 3.0 times per week   Sexual activity: Yes    Partners: Male    Birth control/protection: Condom    Comment: given condoms  Other Topics Concern   Not on file  Social History Narrative   Occupation:  Unemployed   Never Smoked   Alcohol use-yes   Regular exercise-no   Social Determinants of Health   Financial Resource Strain: Not on file  Food Insecurity: Not on file  Transportation Needs: Not on file   Physical Activity: Not on file  Stress: Not on file  Social Connections: Not on file    Labs: Lab Results  Component Value Date   HIV1RNAQUANT Not Detected 02/15/2021   HIV1RNAQUANT Not Detected 12/16/2020   HIV1RNAQUANT Not Detected 08/24/2020   CD4TABS 655 12/16/2020   CD4TABS 631 06/16/2020   CD4TABS 940 05/19/2020    RPR and STI Lab Results  Component Value Date   LABRPR NON-REACTIVE 12/16/2020   LABRPR NON-REACTIVE 06/16/2020   LABRPR NON-REACTIVE 05/07/2019   LABRPR NON-REACTIVE 08/14/2018   LABRPR NON REAC 03/28/2016    STI Results GC GC CT CT  Latest Ref Rng & Units - NEGATIVE - NEGATIVE  02/15/2021 Negative - Negative -  06/16/2020 Negative - Negative -  06/16/2020 Negative - Negative -  06/16/2020 Negative - Negative -  05/07/2019 Negative - Negative -  05/07/2019 Negative - Negative -  05/07/2019 Negative - Negative -  08/14/2018 Negative - Negative -  10/10/2013 - POSITIVE(A) - NEGATIVE    Hepatitis B Lab Results  Component Value Date   HEPBSAB REACTIVE (A) 12/16/2020   HEPBSAG NEGATIVE 11/11/2010   Hepatitis C Lab Results  Component Value Date   HEPCAB NON-REACTIVE 12/16/2020   Hepatitis A Lab Results  Component Value Date   HAV REACTIVE (A) 12/16/2020   Lipids: Lab Results  Component Value Date   CHOL 202 (H) 05/07/2019   TRIG 255 (  H) 05/07/2019   HDL 38 (L) 05/07/2019   CHOLHDL 5.3 (H) 05/07/2019   VLDL 19 12/14/2012   LDLCALC 125 (H) 05/07/2019    TARGET DATE: The 26th  Assessment: Jose Olsen presents today for their maintenance Cabenuva injections. Past injections were tolerated well without issues.  Administered cabotegravir 449m/2mL in left upper outer quadrant of the gluteal muscle. Administered rilpivirine 600 mg/260min the right upper outer quadrant of the gluteal muscle. Monitored patient for 10 minutes after injection. Injections were tolerated well without issue. Will check a HIV RNA today and have patient follow up in 1 month for  next set of injections.  Plan: - Cabenuva injections administered - Next injections scheduled for 3/27 and 4/24 with me; 5/23 with Dr. CoLinus Salmons6/19 and 7/24 with me - Call with any issues or questions  Arlethia Basso L. Gwenetta Devos, PharmD, BCIDP, AAHIVP, CPSilverdalelinical Pharmacist Practitioner [facility] Infectious [facility]

## 2021-05-19 LAB — HIV-1 RNA QUANT-NO REFLEX-BLD
HIV 1 RNA Quant: NOT DETECTED Copies/mL
HIV-1 RNA Quant, Log: NOT DETECTED Log cps/mL

## 2021-06-14 ENCOUNTER — Other Ambulatory Visit (HOSPITAL_COMMUNITY): Payer: Self-pay

## 2021-06-14 ENCOUNTER — Encounter: Payer: Managed Care, Other (non HMO) | Admitting: Pharmacist

## 2021-06-14 ENCOUNTER — Ambulatory Visit: Payer: 59 | Admitting: Pharmacist

## 2021-06-15 ENCOUNTER — Other Ambulatory Visit (HOSPITAL_COMMUNITY): Payer: Self-pay

## 2021-06-16 ENCOUNTER — Telehealth: Payer: Self-pay | Admitting: Pharmacist

## 2021-06-16 NOTE — Telephone Encounter (Signed)
[name] specialty medication [name]) was delivered today and will be administered tomorrow, 3/30. ? ?Margarite Gouge, PharmD, CPP ?Clinical Pharmacist Practitioner ?Infectious Diseases Clinical Pharmacist ?[facility] ? ?

## 2021-06-17 ENCOUNTER — Ambulatory Visit (INDEPENDENT_AMBULATORY_CARE_PROVIDER_SITE_OTHER): Payer: Commercial Managed Care - HMO | Admitting: Pharmacist

## 2021-06-17 ENCOUNTER — Other Ambulatory Visit: Payer: Self-pay

## 2021-06-17 DIAGNOSIS — Z23 Encounter for immunization: Secondary | ICD-10-CM | POA: Diagnosis not present

## 2021-06-17 DIAGNOSIS — B20 Human immunodeficiency virus [HIV] disease: Secondary | ICD-10-CM | POA: Diagnosis not present

## 2021-06-17 MED ORDER — CABOTEGRAVIR & RILPIVIRINE ER 400 & 600 MG/2ML IM SUER
1.0000 | Freq: Once | INTRAMUSCULAR | Status: AC
  Administered 2021-06-17: 1 via INTRAMUSCULAR

## 2021-06-17 NOTE — Progress Notes (Signed)
? ?HPI: Jose Olsen is a 36 y.o. male who presents to the [facility] for [place] administration. ? ?Patient Active Problem List  ? Diagnosis Date Noted  ? Injection education, encounter for 10/13/2020  ? Healthcare maintenance 09/15/2020  ? Screening for STDs (sexually transmitted diseases) 06/16/2020  ? Medication monitoring encounter 11/04/2019  ? HTN (hypertension) 05/07/2019  ? Screening examination for venereal disease 03/28/2016  ? Encounter for long-term (current) use of high-risk medication 03/28/2016  ? Human immunodeficiency virus (HIV) disease ([place]) 12/02/2010  ? ONYCHOMYCOSIS 05/19/2009  ? ? ?[name] Medications  ?New Prescriptions  ? No medications on file  ?Previous Medications  ? CABOTEGRAVIR & RILPIVIRINE ER (CABENUVA) 400 & 600 MG/2ML INJECTION    Inject 1 kit into the muscle every 30 (thirty) days.  ?Modified Medications  ? No medications on file  ?Discontinued Medications  ? No medications on file  ? ? ?Allergies: ?No Known Allergies ? ?Past Medical History: ?Past Medical History:  ?Diagnosis Date  ? Chicken pox   ? HIV infection ([place])   ? ? ?Social History: ?Social History  ? ?Socioeconomic History  ? Marital status: Single  ?  Spouse name: Not on file  ? Number of children: Not on file  ? Years of education: Not on file  ? Highest education level: Not on file  ?Occupational History  ? Not on file  ?Tobacco Use  ? Smoking status: Never  ? Smokeless tobacco: Never  ?Substance and Sexual Activity  ? Alcohol use: Yes  ?  Comment: rarely  ? Drug use: No  ?  Frequency: 3.0 times per week  ? Sexual activity: Yes  ?  Partners: Male  ?  Birth control/protection: Condom  ?  Comment: given condoms  ?Other Topics Concern  ? Not on file  ?Social History Narrative  ? Occupation:  Unemployed  ? Never Smoked  ? Alcohol use-yes  ? Regular exercise-no  ? ?Social Determinants of Health  ? ?Financial Resource Strain: Not on file  ?Food Insecurity: Not on file  ?Transportation Needs: Not on file   ?Physical Activity: Not on file  ?Stress: Not on file  ?Social Connections: Not on file  ? ? ?Labs: ?Lab Results  ?Component Value Date  ? HIV1RNAQUANT Not Detected 05/17/2021  ? HIV1RNAQUANT Not Detected 02/15/2021  ? HIV1RNAQUANT Not Detected 12/16/2020  ? CD4TABS 655 12/16/2020  ? CD4TABS 631 06/16/2020  ? CD4TABS 940 05/19/2020  ? ? ?RPR and STI ?Lab Results  ?Component Value Date  ? LABRPR NON-REACTIVE 12/16/2020  ? LABRPR NON-REACTIVE 06/16/2020  ? LABRPR NON-REACTIVE 05/07/2019  ? LABRPR NON-REACTIVE 08/14/2018  ? LABRPR NON REAC 03/28/2016  ? ? ?STI Results GC GC CT CT  ?Latest Ref Rng & Units  NEGATIVE  NEGATIVE  ?02/15/2021 ? 9:25 AM Negative    Negative     ?06/16/2020 ?10:36 AM Negative    ? Negative    ? Negative    Negative    ? Negative    ? Negative     ?05/07/2019 ? 9:59 AM Negative    ? Negative    ? Negative    Negative    ? Negative    ? Negative     ?08/14/2018 ?12:00 AM Negative    Negative     ?10/10/2013 ? 2:34 AM  POSITIVE    NEGATIVE    ? ? ?Hepatitis B ?Lab Results  ?Component Value Date  ? HEPBSAB REACTIVE (A) 12/16/2020  ? HEPBSAG NEGATIVE 11/11/2010  ? ?  Hepatitis C ?Lab Results  ?Component Value Date  ? HEPCAB NON-REACTIVE 12/16/2020  ? ?Hepatitis A ?Lab Results  ?Component Value Date  ? HAV REACTIVE (A) 12/16/2020  ? ?Lipids: ?Lab Results  ?Component Value Date  ? CHOL 202 (H) 05/07/2019  ? TRIG 255 (H) 05/07/2019  ? HDL 38 (L) 05/07/2019  ? CHOLHDL 5.3 (H) 05/07/2019  ? VLDL 19 12/14/2012  ? LDLCALC 125 (H) 05/07/2019  ? ? ?TARGET DATE:  The 26th of the month ? ? ?Assessment: ?Jose Olsen presents today for their maintenance Cabenuva injections. He gets monthly injections due to not tolerating the pain from 9minjections. Initial/past injections were tolerated well without issues. No problems with systemic effects of injections.  ? ?Administered cabotegravir 40657m57mL in left upper outer quadrant of the gluteal muscle. Administered rilpivirine 600 mg/57m38mn the right upper outer quadrant of  the gluteal muscle. Monitored patient for 10 minutes after injection. Injections were tolerated well without issue. Patient will follow up next months for next injection. ? ?Immunizations: Due to Jose Olsen's HIV+ status he qualifies for the Prevnar 20 vaccine- it was administered today. Also administered the HPV vaccine. Will need next dose in April. ? ?Plan: ?- Cabenuva injections administered ?- Next injections scheduled for 4/24, 5/23, 6/19, 7/24 ?- F/U HIV-RNA ?- HPV Vaccine next dose in 1 month ?- Call with any issues or questions ? ?Thank you for allowing pharmacy to be apart of this patient's care ? ?AnaAsher MuirharmD Candidate ?  ?

## 2021-06-22 ENCOUNTER — Telehealth: Payer: Self-pay

## 2021-06-22 NOTE — Telephone Encounter (Signed)
Error

## 2021-07-02 ENCOUNTER — Telehealth: Payer: Self-pay

## 2021-07-02 NOTE — Telephone Encounter (Signed)
[name] Patient Advocate Encounter ? ?[name] medication (Cabenuva 400& 600mg ) have been couriered to [name] from [employer] and will be administered on the patient next office visit on 07/12/21. ? ?2 box's came today which we will save the other box for the following appointment on 08/10/21. ? ?Ileene Patrick , CPhT ?Specialty Pharmacy Patient Advocate ?[facility] for [facility] ?Phone: [phone] ?Fax:  [phone]  ?

## 2021-07-12 ENCOUNTER — Other Ambulatory Visit: Payer: Self-pay

## 2021-07-12 ENCOUNTER — Ambulatory Visit: Payer: 59 | Admitting: Pharmacist

## 2021-07-12 ENCOUNTER — Ambulatory Visit (INDEPENDENT_AMBULATORY_CARE_PROVIDER_SITE_OTHER): Payer: Managed Care, Other (non HMO) | Admitting: Pharmacist

## 2021-07-12 DIAGNOSIS — Z23 Encounter for immunization: Secondary | ICD-10-CM

## 2021-07-12 DIAGNOSIS — B20 Human immunodeficiency virus [HIV] disease: Secondary | ICD-10-CM

## 2021-07-12 MED ORDER — CABOTEGRAVIR & RILPIVIRINE ER 400 & 600 MG/2ML IM SUER
1.0000 | Freq: Once | INTRAMUSCULAR | Status: AC
  Administered 2021-07-12: 1 via INTRAMUSCULAR

## 2021-07-12 NOTE — Progress Notes (Signed)
? ?HPI: Jose Olsen is a 36 y.o. male who presents to the [facility] [facility] for [place] administration. ? ?Patient Active Problem List  ? Diagnosis Date Noted  ? Injection education, encounter for 10/13/2020  ? Healthcare maintenance 09/15/2020  ? Screening for STDs (sexually transmitted diseases) 06/16/2020  ? Medication monitoring encounter 11/04/2019  ? HTN (hypertension) 05/07/2019  ? Screening examination for venereal disease 03/28/2016  ? Encounter for long-term (current) use of high-risk medication 03/28/2016  ? Human immunodeficiency virus (HIV) disease ([place]) 12/02/2010  ? ONYCHOMYCOSIS 05/19/2009  ? ? ?[name] Medications  ?New Prescriptions  ? No medications on file  ?Previous Medications  ? CABOTEGRAVIR & RILPIVIRINE ER (CABENUVA) 400 & 600 MG/2ML INJECTION    Inject 1 kit into the muscle every 30 (thirty) days.  ?Modified Medications  ? No medications on file  ?Discontinued Medications  ? No medications on file  ? ? ?Allergies: ?No Known Allergies ? ?Past Medical History: ?Past Medical History:  ?Diagnosis Date  ? Chicken pox   ? HIV infection ([place])   ? ? ?Social History: ?Social History  ? ?Socioeconomic History  ? Marital status: Single  ?  Spouse name: Not on file  ? Number of children: Not on file  ? Years of education: Not on file  ? Highest education level: Not on file  ?Occupational History  ? Not on file  ?Tobacco Use  ? Smoking status: Never  ? Smokeless tobacco: Never  ?Substance and Sexual Activity  ? Alcohol use: Yes  ?  Comment: rarely  ? Drug use: No  ?  Frequency: 3.0 times per week  ? Sexual activity: Yes  ?  Partners: Male  ?  Birth control/protection: Condom  ?  Comment: given condoms  ?Other Topics Concern  ? Not on file  ?Social History Narrative  ? Occupation:  Unemployed  ? Never Smoked  ? Alcohol use-yes  ? Regular exercise-no  ? ?Social Determinants of Health  ? ?Financial Resource Strain: Not on file  ?Food Insecurity: Not on file  ?Transportation Needs: Not on file   ?Physical Activity: Not on file  ?Stress: Not on file  ?Social Connections: Not on file  ? ? ?Labs: ?Lab Results  ?Component Value Date  ? HIV1RNAQUANT Not Detected 05/17/2021  ? HIV1RNAQUANT Not Detected 02/15/2021  ? HIV1RNAQUANT Not Detected 12/16/2020  ? CD4TABS 655 12/16/2020  ? CD4TABS 631 06/16/2020  ? CD4TABS 940 05/19/2020  ? ? ?RPR and STI ?Lab Results  ?Component Value Date  ? LABRPR NON-REACTIVE 12/16/2020  ? LABRPR NON-REACTIVE 06/16/2020  ? LABRPR NON-REACTIVE 05/07/2019  ? LABRPR NON-REACTIVE 08/14/2018  ? LABRPR NON REAC 03/28/2016  ? ? ?STI Results GC GC CT CT  ?Latest Ref Rng & Units  NEGATIVE  NEGATIVE  ?02/15/2021 ? 9:25 AM Negative    Negative     ?06/16/2020 ?10:36 AM Negative    ? Negative    ? Negative    Negative    ? Negative    ? Negative     ?05/07/2019 ? 9:59 AM Negative    ? Negative    ? Negative    Negative    ? Negative    ? Negative     ?08/14/2018 ?12:00 AM Negative    Negative     ?10/10/2013 ? 2:34 AM  POSITIVE    NEGATIVE    ? ? ?Hepatitis B ?Lab Results  ?Component Value Date  ? HEPBSAB REACTIVE (A) 12/16/2020  ? HEPBSAG NEGATIVE 11/11/2010  ? ?  Hepatitis C ?Lab Results  ?Component Value Date  ? HEPCAB NON-REACTIVE 12/16/2020  ? ?Hepatitis A ?Lab Results  ?Component Value Date  ? HAV REACTIVE (A) 12/16/2020  ? ?Lipids: ?Lab Results  ?Component Value Date  ? CHOL 202 (H) 05/07/2019  ? TRIG 255 (H) 05/07/2019  ? HDL 38 (L) 05/07/2019  ? CHOLHDL 5.3 (H) 05/07/2019  ? VLDL 19 12/14/2012  ? LDLCALC 125 (H) 05/07/2019  ? ? ?TARGET DATE: ?26th of the month (every month) ? ?Assessment: ?Norberto presents today for their monthly maintenance Cabenuva injections. Past injections were tolerated well without issues. No problems with systemic side effects of injections. Patient has not had any new sexual partners since last visit and defers STI testing today.  ? ?Today patient is due for HPV 2/3 vaccine and Tdap booster. Patient agreeable to receiving both vaccines. Will give HPV 2/3 and Tdap  booster today. HPV 3/3 due in 5 months.  ? ?Administered cabotegravir 400mg /44mL in left upper outer quadrant of the gluteal muscle. Administered rilpivirine 600 mg/48mL in the right upper outer quadrant of the gluteal muscle. Injections were tolerated well. Patient will follow up in 1 month for next set of injections. Will check HIV at next visit.  ? ?Plan: ?- Cabenuva injections administered ?- HPV 2/3 and Tdap booster given today ?- HPV 3/3 due in 5 month (September) ?- Next injections scheduled for 5/23 with Dr. Linus Salmons, 6/19 & 7/24 with [name] ?- Call with any issues or questions ? ?Marlowe Alt, PharmD Candidate ?07/12/2021 1:34 PM ? ?

## 2021-08-10 ENCOUNTER — Telehealth: Payer: Self-pay

## 2021-08-10 ENCOUNTER — Encounter: Payer: Commercial Managed Care - HMO | Admitting: Internal Medicine

## 2021-08-10 NOTE — Telephone Encounter (Signed)
Patient can not get to appt 08/10/21. Rescheduled for 08/12/21. Reminded of target date of 08/13/21 for [facility] and he has reassured me he will be here at 10 am Dr Luciana Axe aware on 08/12/21.

## 2021-08-12 ENCOUNTER — Other Ambulatory Visit: Payer: Self-pay

## 2021-08-12 ENCOUNTER — Encounter: Payer: Self-pay | Admitting: Internal Medicine

## 2021-08-12 ENCOUNTER — Ambulatory Visit (INDEPENDENT_AMBULATORY_CARE_PROVIDER_SITE_OTHER): Payer: Commercial Managed Care - HMO | Admitting: Internal Medicine

## 2021-08-12 ENCOUNTER — Other Ambulatory Visit (HOSPITAL_COMMUNITY)
Admission: RE | Admit: 2021-08-12 | Discharge: 2021-08-12 | Disposition: A | Discharge status: Home / Self Care | Payer: Commercial Managed Care - HMO | Source: Ambulatory Visit | Attending: Internal Medicine | Admitting: Internal Medicine

## 2021-08-12 ENCOUNTER — Other Ambulatory Visit (HOSPITAL_COMMUNITY): Payer: Self-pay

## 2021-08-12 VITALS — BP 115/82 | HR 77 | Resp 16 | Ht 70.0 in | Wt 191.0 lb

## 2021-08-12 DIAGNOSIS — Z113 Encounter for screening for infections with a predominantly sexual mode of transmission: Secondary | ICD-10-CM | POA: Diagnosis not present

## 2021-08-12 DIAGNOSIS — Z5181 Encounter for therapeutic drug level monitoring: Secondary | ICD-10-CM | POA: Diagnosis not present

## 2021-08-12 DIAGNOSIS — B20 Human immunodeficiency virus [HIV] disease: Secondary | ICD-10-CM

## 2021-08-12 DIAGNOSIS — Z79899 Other long term (current) drug therapy: Secondary | ICD-10-CM | POA: Diagnosis not present

## 2021-08-12 LAB — URINE CYTOLOGY ANCILLARY ONLY
Chlamydia: NEGATIVE
Comment: NEGATIVE
Comment: NORMAL
Neisseria Gonorrhea: NEGATIVE

## 2021-08-12 LAB — CYTOLOGY, (ORAL, ANAL, URETHRAL) ANCILLARY ONLY
Chlamydia: NEGATIVE
Chlamydia: NEGATIVE
Comment: NEGATIVE
Comment: NEGATIVE
Comment: NORMAL
Comment: NORMAL
Neisseria Gonorrhea: NEGATIVE
Neisseria Gonorrhea: NEGATIVE

## 2021-08-12 MED ORDER — CABOTEGRAVIR & RILPIVIRINE ER 400 & 600 MG/2ML IM SUER
1.0000 | Freq: Once | INTRAMUSCULAR | Status: AC
  Administered 2021-08-12: 1 via INTRAMUSCULAR

## 2021-08-12 NOTE — Progress Notes (Signed)
   Subjective:    Patient ID: Jose Olsen, male    DOB: [birth year], 36 y.o.   MRN: [id number]  HPI Here for follow up of HIV  He continues on Cabenuva monthly injections and no new issues.  No concerns today.  Viral load has remained undetectable.     Review of Systems  Constitutional:  Negative for fatigue.  Gastrointestinal:  Negative for diarrhea and nausea.  Skin:  Negative for rash.      Objective:   Physical Exam Eyes:     General: No scleral icterus. Pulmonary:     Effort: Pulmonary effort is normal.  Skin:    Findings: No rash.  Neurological:     Mental Status: He is alert.  Psychiatric:        Mood and Affect: Mood normal.   SH: no tobacco       Assessment & Plan:

## 2021-08-12 NOTE — Assessment & Plan Note (Signed)
Will check his lipid panel 

## 2021-08-12 NOTE — Assessment & Plan Note (Signed)
Will screen today 

## 2021-08-12 NOTE — Assessment & Plan Note (Signed)
He is doing well on Cabenuva injections and pleased with not having to take a pill.  No concerns and will give today.   Has follow up with pharmacy next month already.

## 2021-08-13 ENCOUNTER — Telehealth: Payer: Self-pay

## 2021-08-13 LAB — T-HELPER CELL (CD4) - (RCID CLINIC ONLY)
CD4 % Helper T Cell: 42 % (ref 33–65)
CD4 T Cell Abs: 753 /uL (ref 400–1790)

## 2021-08-13 NOTE — Telephone Encounter (Signed)
[name] Patient Advocate Encounter  Prior Authorization for Cabenuva(400mg -600mg ) has been approved.  (Medical Benefits)  PA# Effective dates: 06/15/21 through 06/14/22  Patient is enrolled in [employer] [facility] [place]        [name] will continue to follow.  06/16/22, CPhT Specialty Pharmacy Patient [facility] for Infectious Disease Phone: [phone] Fax:  [phone]

## 2021-08-16 LAB — HIV-1 RNA QUANT-NO REFLEX-BLD
HIV 1 RNA Quant: NOT DETECTED copies/mL
HIV-1 RNA Quant, Log: NOT DETECTED Log copies/mL

## 2021-08-16 LAB — COMPLETE METABOLIC PANEL WITH GFR
AG Ratio: 1.7 (calc) (ref 1.0–2.5)
ALT: 13 U/L (ref 9–46)
AST: 15 U/L (ref 10–40)
Albumin: 4.7 g/dL (ref 3.6–5.1)
Alkaline phosphatase (APISO): 59 U/L (ref 36–130)
BUN: 11 mg/dL (ref 7–25)
CO2: 29 mmol/L (ref 20–32)
Calcium: 9.8 mg/dL (ref 8.6–10.3)
Chloride: 106 mmol/L (ref 98–110)
Creat: 1 mg/dL (ref 0.60–1.26)
Globulin: 2.7 g/dL (calc) (ref 1.9–3.7)
Glucose, Bld: 89 mg/dL (ref 65–99)
Potassium: 4.2 mmol/L (ref 3.5–5.3)
Sodium: 142 mmol/L (ref 135–146)
Total Bilirubin: 0.7 mg/dL (ref 0.2–1.2)
Total Protein: 7.4 g/dL (ref 6.1–8.1)
eGFR: 100 mL/min/{1.73_m2} (ref >=60)

## 2021-08-16 LAB — CBC WITH DIFFERENTIAL/PLATELET
Absolute Monocytes: 344 cells/uL (ref 200–950)
Basophils Absolute: 29 cells/uL (ref 0–200)
Basophils Relative: 0.7 %
Eosinophils Absolute: 111 cells/uL (ref 15–500)
Eosinophils Relative: 2.7 %
HCT: 42.7 % (ref 38.5–50.0)
Hemoglobin: 14.8 g/dL (ref 13.2–17.1)
Lymphs Abs: 2013 cells/uL (ref 850–3900)
MCH: 32.9 pg (ref 27.0–33.0)
MCHC: 34.7 g/dL (ref 32.0–36.0)
MCV: 94.9 fL (ref 80.0–100.0)
MPV: 9.9 fL (ref 7.5–12.5)
Monocytes Relative: 8.4 %
Neutro Abs: 1603 cells/uL (ref 1500–7800)
Neutrophils Relative %: 39.1 %
Platelets: 254 10*3/uL (ref 140–400)
RBC: 4.5 10*6/uL (ref 4.20–5.80)
RDW: 11.7 % (ref 11.0–15.0)
Total Lymphocyte: 49.1 %
WBC: 4.1 10*3/uL (ref 3.8–10.8)

## 2021-08-16 LAB — LIPID PANEL
Cholesterol: 194 mg/dL (ref <200)
HDL: 39 mg/dL — ABNORMAL LOW (ref >=40)
LDL Cholesterol (Calc): 128 mg/dL (calc) — ABNORMAL HIGH
Non-HDL Cholesterol (Calc): 155 mg/dL (calc) — ABNORMAL HIGH (ref <130)
Total CHOL/HDL Ratio: 5 (calc) — ABNORMAL HIGH (ref <5.0)
Triglycerides: 157 mg/dL — ABNORMAL HIGH (ref <150)

## 2021-08-16 LAB — RPR: RPR Ser Ql: NONREACTIVE

## 2021-08-23 ENCOUNTER — Telehealth: Payer: Self-pay

## 2021-08-23 ENCOUNTER — Other Ambulatory Visit: Payer: Self-pay | Admitting: Internal Medicine

## 2021-08-23 ENCOUNTER — Other Ambulatory Visit: Payer: Self-pay

## 2021-08-23 ENCOUNTER — Telehealth: Payer: Commercial Managed Care - HMO | Admitting: Physician Assistant

## 2021-08-23 DIAGNOSIS — Z91199 Patient's noncompliance with other medical treatment and regimen due to unspecified reason: Secondary | ICD-10-CM

## 2021-08-23 MED ORDER — FLUCONAZOLE 200 MG PO TABS: 200.0000 mg | ORAL_TABLET | Freq: Every day | ORAL | 0 refills | Status: DC

## 2021-08-23 NOTE — Telephone Encounter (Signed)
Patient calling requesting a prescription for fluconazole for a rash on his leg. Patient stated this was discussed at his previous appointment. Patient denies any other symptoms. Patient stated the the topical creams he is using is not working. I advised the patient we may need to see him to evaluate the rash. Please advise

## 2021-08-23 NOTE — Telephone Encounter (Signed)
Patient informed. 

## 2021-08-23 NOTE — Progress Notes (Signed)
The patient no-showed for appointment despite this provider sending direct link x 2 with no response and waiting for at least 10 minutes from appointment time for patient to join. They will be marked as a NS for this appointment/time.   Jeslin Bazinet M Claudette Wermuth, PA-C    

## 2021-09-06 ENCOUNTER — Encounter: Payer: Commercial Managed Care - HMO | Admitting: Pharmacist

## 2021-09-10 ENCOUNTER — Other Ambulatory Visit (HOSPITAL_COMMUNITY): Payer: Self-pay

## 2021-09-10 ENCOUNTER — Encounter: Payer: Commercial Managed Care - HMO | Admitting: Pharmacist

## 2021-09-13 ENCOUNTER — Ambulatory Visit (INDEPENDENT_AMBULATORY_CARE_PROVIDER_SITE_OTHER): Payer: Commercial Managed Care - HMO | Admitting: Pharmacist

## 2021-09-13 ENCOUNTER — Other Ambulatory Visit: Payer: Self-pay

## 2021-09-13 DIAGNOSIS — B20 Human immunodeficiency virus [HIV] disease: Secondary | ICD-10-CM

## 2021-09-13 MED ORDER — CABOTEGRAVIR & RILPIVIRINE ER 400 & 600 MG/2ML IM SUER
1.0000 | Freq: Once | INTRAMUSCULAR | Status: AC
  Administered 2021-09-13: 1 via INTRAMUSCULAR

## 2021-09-20 ENCOUNTER — Encounter: Payer: Managed Care, Other (non HMO) | Admitting: Pharmacist

## 2021-09-23 ENCOUNTER — Other Ambulatory Visit (HOSPITAL_COMMUNITY): Payer: Self-pay

## 2021-09-24 ENCOUNTER — Telehealth: Payer: Self-pay

## 2021-09-24 NOTE — Telephone Encounter (Signed)
[name] Patient Advocate Encounter  [name] medication (Cabenuva 400-600mg ) have been couriered to [name] from [employer] and will be administered on the patient next office visit on 10/07/21.  [name] , CPhT Specialty Pharmacy Patient [facility] for Infectious Disease Phone: [phone] Fax:  [phone]

## 2021-10-07 ENCOUNTER — Other Ambulatory Visit: Payer: Self-pay

## 2021-10-07 ENCOUNTER — Encounter: Payer: Commercial Managed Care - HMO | Admitting: Pharmacist

## 2021-10-07 ENCOUNTER — Ambulatory Visit: Payer: Commercial Managed Care - HMO

## 2021-10-07 ENCOUNTER — Ambulatory Visit (INDEPENDENT_AMBULATORY_CARE_PROVIDER_SITE_OTHER): Payer: Commercial Managed Care - HMO | Admitting: Pharmacist

## 2021-10-07 DIAGNOSIS — B20 Human immunodeficiency virus [HIV] disease: Secondary | ICD-10-CM | POA: Diagnosis not present

## 2021-10-07 MED ORDER — CABOTEGRAVIR & RILPIVIRINE ER 400 & 600 MG/2ML IM SUER
1.0000 | Freq: Once | INTRAMUSCULAR | Status: AC
  Administered 2021-10-07: 1 via INTRAMUSCULAR

## 2021-10-07 NOTE — Progress Notes (Signed)
HPI: Jose Olsen is a 36 y.o. male who presents to the [place] clinic for [place] administration.  Patient Active Problem List   Diagnosis Date Noted   Injection education, encounter for 10/13/2020   Healthcare maintenance 09/15/2020   Screening for STDs (sexually transmitted diseases) 06/16/2020   Medication monitoring encounter 11/04/2019   HTN (hypertension) 05/07/2019   Screening examination for venereal disease 03/28/2016   Encounter for long-term (current) use of high-risk medication 03/28/2016   Human immunodeficiency virus (HIV) disease ([place]) 12/02/2010   ONYCHOMYCOSIS 05/19/2009    [name] Medications  New Prescriptions   No medications on file  Previous Medications   CABOTEGRAVIR & RILPIVIRINE ER (CABENUVA) 400 & 600 MG/2ML INJECTION    Inject 1 kit into the muscle every 30 (thirty) days.   FLUCONAZOLE (DIFLUCAN) 200 MG TABLET    Take 1 tablet (200 mg total) by mouth daily.  Modified Medications   No medications on file  Discontinued Medications   No medications on file    Allergies: No Known Allergies  Past Medical History: Past Medical History:  Diagnosis Date   Chicken pox    HIV infection ([place])     Social History: Social History   Socioeconomic History   Marital status: Single    Spouse name: Not on file   Number of children: Not on file   Years of education: Not on file   Highest education level: Not on file  Occupational History   Not on file  Tobacco Use   Smoking status: Never   Smokeless tobacco: Never  Substance and Sexual Activity   Alcohol use: Yes    Comment: rarely   Drug use: No    Frequency: 3.0 times per week   Sexual activity: Yes    Partners: Male    Birth control/protection: Condom    Comment: given condoms  Other Topics Concern   Not on file  Social History Narrative   Occupation:  Unemployed   Never Smoked   Alcohol use-yes   Regular exercise-no   Social Determinants of Health   Financial Resource  Strain: Not on file  Food Insecurity: Not on file  Transportation Needs: Not on file  Physical Activity: Not on file  Stress: Not on file  Social Connections: Not on file    Labs: Lab Results  Component Value Date   HIV1RNAQUANT NOT DETECTED 08/12/2021   HIV1RNAQUANT Not Detected 05/17/2021   HIV1RNAQUANT Not Detected 02/15/2021   CD4TABS 753 08/12/2021   CD4TABS 655 12/16/2020   CD4TABS 631 06/16/2020    RPR and STI Lab Results  Component Value Date   LABRPR NON-REACTIVE 08/12/2021   LABRPR NON-REACTIVE 12/16/2020   LABRPR NON-REACTIVE 06/16/2020   LABRPR NON-REACTIVE 05/07/2019   LABRPR NON-REACTIVE 08/14/2018    STI Results GC GC CT CT  Latest Ref Rng & Units  NEGATIVE  NEGATIVE  08/12/2021 10:14 AM Negative    Negative    Negative   Negative    Negative    Negative    02/15/2021  9:25 AM Negative   Negative    06/16/2020 10:36 AM Negative    Negative    Negative   Negative    Negative    Negative    05/07/2019  9:59 AM Negative    Negative    Negative   Negative    Negative    Negative    08/14/2018 12:00 AM Negative   Negative    10/10/2013  2:34 AM  POSITIVE  NEGATIVE     Hepatitis B Lab Results  Component Value Date   HEPBSAB REACTIVE (A) 12/16/2020   HEPBSAG NEGATIVE 11/11/2010   Hepatitis C Lab Results  Component Value Date   HEPCAB NON-REACTIVE 12/16/2020   Hepatitis A Lab Results  Component Value Date   HAV REACTIVE (A) 12/16/2020   Lipids: Lab Results  Component Value Date   CHOL 194 08/12/2021   TRIG 157 (H) 08/12/2021   HDL 39 (L) 08/12/2021   CHOLHDL 5.0 (H) 08/12/2021   VLDL 19 12/14/2012   LDLCALC 128 (H) 08/12/2021    TARGET DATE: The 26th  Assessment: Jose Olsen presents today for his maintenance Cabenuva injections. Past injections were tolerated well without issues.  Administered cabotegravir 400mg /73mL in left upper outer quadrant of the gluteal muscle. Administered rilpivirine 600 mg/55mL in the right upper outer  quadrant of the gluteal muscle. No issues with injections. He will follow up in one month for next set of injections.  Plan: - Cabenuva injections administered - Next injections scheduled for 11/11/21, 12/09/21, 01/06/22, and 02/08/22 - Call with any issues or questions  Mateo Overbeck L. Shawnell Dykes, PharmD, BCIDP, AAHIVP, [place] Clinical Pharmacist Practitioner [place] for Infectious Disease

## 2021-10-08 ENCOUNTER — Other Ambulatory Visit: Payer: Self-pay

## 2021-10-08 ENCOUNTER — Ambulatory Visit: Payer: Commercial Managed Care - HMO

## 2021-10-11 ENCOUNTER — Encounter: Payer: Managed Care, Other (non HMO) | Admitting: Pharmacist

## 2021-10-26 ENCOUNTER — Telehealth: Payer: Self-pay

## 2021-10-26 NOTE — Telephone Encounter (Signed)
[name] Patient Advocate Encounter  [name] medication (Cabenuva 400-600mg ) have been couriered to [name] from [employer] and will be administered on the patient next office visit on 11/11/21.  [name] , CPhT Specialty Pharmacy Patient [facility] for Infectious Disease Phone: [phone] Fax:  [phone]

## 2021-11-04 ENCOUNTER — Encounter: Payer: Commercial Managed Care - HMO | Admitting: Pharmacist

## 2021-11-09 ENCOUNTER — Ambulatory Visit (INDEPENDENT_AMBULATORY_CARE_PROVIDER_SITE_OTHER): Payer: Commercial Managed Care - HMO | Admitting: Pharmacist

## 2021-11-09 ENCOUNTER — Other Ambulatory Visit: Payer: Self-pay

## 2021-11-09 DIAGNOSIS — B20 Human immunodeficiency virus [HIV] disease: Secondary | ICD-10-CM

## 2021-11-09 MED ORDER — CABOTEGRAVIR & RILPIVIRINE ER 400 & 600 MG/2ML IM SUER
1.0000 | Freq: Once | INTRAMUSCULAR | Status: AC
  Administered 2021-11-09: 1 via INTRAMUSCULAR

## 2021-11-09 NOTE — Progress Notes (Signed)
HPI: Jose Olsen is a 36 y.o. male who presents to the [place] clinic for [place] administration.  Patient Active Problem List   Diagnosis Date Noted   Injection education, encounter for 10/13/2020   Healthcare maintenance 09/15/2020   Screening for STDs (sexually transmitted diseases) 06/16/2020   Medication monitoring encounter 11/04/2019   HTN (hypertension) 05/07/2019   Screening examination for venereal disease 03/28/2016   Encounter for long-term (current) use of high-risk medication 03/28/2016   Human immunodeficiency virus (HIV) disease ([place]) 12/02/2010   ONYCHOMYCOSIS 05/19/2009    [name] Medications  New Prescriptions   No medications on file  Previous Medications   CABOTEGRAVIR & RILPIVIRINE ER (CABENUVA) 400 & 600 MG/2ML INJECTION    Inject 1 kit into the muscle every 30 (thirty) days.   FLUCONAZOLE (DIFLUCAN) 200 MG TABLET    Take 1 tablet (200 mg total) by mouth daily.  Modified Medications   No medications on file  Discontinued Medications   No medications on file    Allergies: No Known Allergies  Past Medical History: Past Medical History:  Diagnosis Date   Chicken pox    HIV infection ([place])     Social History: Social History   Socioeconomic History   Marital status: Single    Spouse name: Not on file   Number of children: Not on file   Years of education: Not on file   Highest education level: Not on file  Occupational History   Not on file  Tobacco Use   Smoking status: Never   Smokeless tobacco: Never  Substance and Sexual Activity   Alcohol use: Yes    Comment: rarely   Drug use: No    Frequency: 3.0 times per week   Sexual activity: Yes    Partners: Male    Birth control/protection: Condom    Comment: given condoms  Other Topics Concern   Not on file  Social History Narrative   Occupation:  Unemployed   Never Smoked   Alcohol use-yes   Regular exercise-no   Social Determinants of Health   Financial Resource  Strain: Not on file  Food Insecurity: Not on file  Transportation Needs: Not on file  Physical Activity: Not on file  Stress: Not on file  Social Connections: Not on file    Labs: Lab Results  Component Value Date   HIV1RNAQUANT NOT DETECTED 08/12/2021   HIV1RNAQUANT Not Detected 05/17/2021   HIV1RNAQUANT Not Detected 02/15/2021   CD4TABS 753 08/12/2021   CD4TABS 655 12/16/2020   CD4TABS 631 06/16/2020    RPR and STI Lab Results  Component Value Date   LABRPR NON-REACTIVE 08/12/2021   LABRPR NON-REACTIVE 12/16/2020   LABRPR NON-REACTIVE 06/16/2020   LABRPR NON-REACTIVE 05/07/2019   LABRPR NON-REACTIVE 08/14/2018    STI Results GC GC CT CT  Latest Ref Rng & Units  NEGATIVE  NEGATIVE  08/12/2021 10:14 AM Negative    Negative    Negative   Negative    Negative    Negative    02/15/2021  9:25 AM Negative   Negative    06/16/2020 10:36 AM Negative    Negative    Negative   Negative    Negative    Negative    05/07/2019  9:59 AM Negative    Negative    Negative   Negative    Negative    Negative    08/14/2018 12:00 AM Negative   Negative    10/10/2013  2:34 AM  POSITIVE  NEGATIVE     Hepatitis B Lab Results  Component Value Date   HEPBSAB REACTIVE (A) 12/16/2020   HEPBSAG NEGATIVE 11/11/2010   Hepatitis C Lab Results  Component Value Date   HEPCAB NON-REACTIVE 12/16/2020   Hepatitis A Lab Results  Component Value Date   HAV REACTIVE (A) 12/16/2020   Lipids: Lab Results  Component Value Date   CHOL 194 08/12/2021   TRIG 157 (H) 08/12/2021   HDL 39 (L) 08/12/2021   CHOLHDL 5.0 (H) 08/12/2021   VLDL 19 12/14/2012   LDLCALC 128 (H) 08/12/2021    TARGET DATE: The 26th of the month   Assessment: Jose Olsen presents today for his maintenance Cabenuva injections. Past injections were tolerated well without issues.  Administered cabotegravir 476m/2mL in left upper outer quadrant of the gluteal muscle. Administered rilpivirine 600 mg/253min the  right upper outer quadrant of the gluteal muscle. No issues with injections. Jose Olsen follow up in 1 month for next set of injections.  Condoms offered and accepted during the visit. Patient politely declined STI testing at this time.   Plan: - Cabenuva injections administered - Obtain HIV RNA, CD4 count, STI testing (if desired) at next visit  - Next injections scheduled for 12/09/2021 with CaMagda KielPharmD, BCIDP, AAHIVP - Call with any issues or questions  AuAdria DillPharmD PGY-2 Infectious Diseases Resident  11/09/2021 1:58 PM

## 2021-11-11 ENCOUNTER — Encounter: Payer: Commercial Managed Care - HMO | Admitting: Pharmacist

## 2021-11-30 ENCOUNTER — Other Ambulatory Visit: Payer: Self-pay | Admitting: Pharmacist

## 2021-11-30 DIAGNOSIS — B20 Human immunodeficiency virus [HIV] disease: Secondary | ICD-10-CM

## 2021-11-30 MED ORDER — CABOTEGRAVIR & RILPIVIRINE ER 400 & 600 MG/2ML IM SUER: 1.0000 | INTRAMUSCULAR | 11 refills | Status: DC

## 2021-12-02 ENCOUNTER — Telehealth: Payer: Self-pay

## 2021-12-02 NOTE — Telephone Encounter (Signed)
[name] Patient Advocate Encounter  [name] medications have been couriered to [name] from [employer]: [phone], and will be administered on 12/09/2021.

## 2021-12-09 ENCOUNTER — Other Ambulatory Visit: Payer: Self-pay

## 2021-12-09 ENCOUNTER — Encounter: Payer: Commercial Managed Care - HMO | Admitting: Pharmacist

## 2021-12-09 ENCOUNTER — Ambulatory Visit (INDEPENDENT_AMBULATORY_CARE_PROVIDER_SITE_OTHER): Payer: Commercial Managed Care - HMO | Admitting: Pharmacist

## 2021-12-09 DIAGNOSIS — Z23 Encounter for immunization: Secondary | ICD-10-CM

## 2021-12-09 DIAGNOSIS — B20 Human immunodeficiency virus [HIV] disease: Secondary | ICD-10-CM | POA: Diagnosis not present

## 2021-12-09 MED ORDER — CABOTEGRAVIR & RILPIVIRINE ER 400 & 600 MG/2ML IM SUER
1.0000 | Freq: Once | INTRAMUSCULAR | Status: AC
  Administered 2021-12-09: 1 via INTRAMUSCULAR

## 2021-12-09 NOTE — Addendum Note (Signed)
Addended by: Magda Kiel L on: 12/09/2021 02:50 PM   Modules accepted: Orders

## 2021-12-09 NOTE — Progress Notes (Signed)
HPI: Jose Olsen is a 36 y.o. male who presents to the [place] clinic for [place] administration.       Patient Active Problem List    Diagnosis Date Noted   Injection education, encounter for 10/13/2020   Healthcare maintenance 09/15/2020   Screening for STDs (sexually transmitted diseases) 06/16/2020   Medication monitoring encounter 11/04/2019   HTN (hypertension) 05/07/2019   Screening examination for venereal disease 03/28/2016   Encounter for long-term (current) use of high-risk medication 03/28/2016   Human immunodeficiency virus (HIV) disease ([place]) 12/02/2010   ONYCHOMYCOSIS 05/19/2009          [name] Medications  New Prescriptions    No medications on file  Previous Medications    CABOTEGRAVIR & RILPIVIRINE ER (CABENUVA) 400 & 600 MG/2ML INJECTION    Inject 1 kit into the muscle every 30 (thirty) days.    FLUCONAZOLE (DIFLUCAN) 200 MG TABLET    Take 1 tablet (200 mg total) by mouth daily.  Modified Medications    No medications on file  Discontinued Medications    No medications on file      Allergies: No Known Allergies   Past Medical History:     Past Medical History:  Diagnosis Date   Chicken pox     HIV infection ([place])        Social History: Social History         Socioeconomic History   Marital status: Single      Spouse name: Not on file   Number of children: Not on file   Years of education: Not on file   Highest education level: Not on file  Occupational History   Not on file  Tobacco Use   Smoking status: Never   Smokeless tobacco: Never  Substance and Sexual Activity   Alcohol use: Yes      Comment: rarely   Drug use: No      Frequency: 3.0 times per week   Sexual activity: Yes      Partners: Male      Birth control/protection: Condom      Comment: given condoms  Other Topics Concern   Not on file  Social History Narrative    Occupation:  Unemployed    Never Smoked    Alcohol use-yes    Regular exercise-no     Social Determinants of Health    Financial Resource Strain: Not on file  Food Insecurity: Not on file  Transportation Needs: Not on file  Physical Activity: Not on file  Stress: Not on file  Social Connections: Not on file      Labs:      Lab Results  Component Value Date    HIV1RNAQUANT NOT DETECTED 08/12/2021    HIV1RNAQUANT Not Detected 05/17/2021    HIV1RNAQUANT Not Detected 02/15/2021    CD4TABS 753 08/12/2021    CD4TABS 655 12/16/2020    CD4TABS 631 06/16/2020      RPR and STI      Lab Results  Component Value Date    LABRPR NON-REACTIVE 08/12/2021    LABRPR NON-REACTIVE 12/16/2020    LABRPR NON-REACTIVE 06/16/2020    LABRPR NON-REACTIVE 05/07/2019    LABRPR NON-REACTIVE 08/14/2018      STI Results GC GC CT CT  Latest Ref Rng & Units   NEGATIVE   NEGATIVE  08/12/2021 10:14 AM Negative    Negative    Negative    Negative    Negative    Negative  02/15/2021  9:25 AM Negative    Negative     06/16/2020 10:36 AM Negative    Negative    Negative    Negative    Negative    Negative     05/07/2019  9:59 AM Negative    Negative    Negative    Negative    Negative    Negative     08/14/2018 12:00 AM Negative    Negative     10/10/2013  2:34 AM   POSITIVE    NEGATIVE       Hepatitis B      Lab Results  Component Value Date    HEPBSAB REACTIVE (A) 12/16/2020    HEPBSAG NEGATIVE 11/11/2010    Hepatitis C      Lab Results  Component Value Date    HEPCAB NON-REACTIVE 12/16/2020    Hepatitis A      Lab Results  Component Value Date    HAV REACTIVE (A) 12/16/2020    Lipids:      Lab Results  Component Value Date    CHOL 194 08/12/2021    TRIG 157 (H) 08/12/2021    HDL 39 (L) 08/12/2021    CHOLHDL 5.0 (H) 08/12/2021    VLDL 19 12/14/2012    LDLCALC 128 (H) 08/12/2021      TARGET DATE: The 26th   Assessment: Jose Olsen presents today for his maintenance Cabenuva injections. Past injections were tolerated well without issues. He has not  had any new partners since last tested for STIs so politely declines today. He received his 3rd and final HPV vaccine today. He also agreed to receive the flu vaccine as well. Of note, he thinks he may transition to the every 2 month injections at the beginning of 2024. He will continue to think about this.   Administered cabotegravir 454m/2mL in left upper outer quadrant of the gluteal muscle. Administered rilpivirine 600 mg/272min the right upper outer quadrant of the gluteal muscle. No issues with injections. He will follow up in one month for next set of injections.   Plan: - Cabenuva injections administered - HIV RNA today - HPV vaccine #3/3 - Flu vaccine - Next injections scheduled for 01/06/22 with me and 02/08/22 with Dr. CoLinus Salmons- Call with any issues or questions   Tyneshia Stivers L. Kismet Facemire, PharmD, BCIDP, AAHIVP, CPPerrylinical Pharmacist Practitioner [place] [facility]

## 2021-12-12 LAB — HIV-1 RNA QUANT-NO REFLEX-BLD
HIV 1 RNA Quant: <20 Copies/mL — ABNORMAL HIGH
HIV-1 RNA Quant, Log: <1.3 Log cps/mL — ABNORMAL HIGH

## 2021-12-29 ENCOUNTER — Telehealth: Payer: Self-pay

## 2021-12-29 NOTE — Telephone Encounter (Signed)
[name] Patient Advocate Encounter  [name] medications have been couriered to [name] from [employer] , and will be administered on 01/06/2022.

## 2022-01-06 ENCOUNTER — Encounter: Payer: Commercial Managed Care - HMO | Admitting: Pharmacist

## 2022-01-10 ENCOUNTER — Ambulatory Visit (INDEPENDENT_AMBULATORY_CARE_PROVIDER_SITE_OTHER): Payer: Commercial Managed Care - HMO | Admitting: Pharmacist

## 2022-01-10 ENCOUNTER — Other Ambulatory Visit: Payer: Self-pay

## 2022-01-10 ENCOUNTER — Ambulatory Visit (INDEPENDENT_AMBULATORY_CARE_PROVIDER_SITE_OTHER): Payer: Commercial Managed Care - HMO

## 2022-01-10 DIAGNOSIS — Z23 Encounter for immunization: Secondary | ICD-10-CM

## 2022-01-10 DIAGNOSIS — B20 Human immunodeficiency virus [HIV] disease: Secondary | ICD-10-CM | POA: Diagnosis not present

## 2022-01-10 MED ORDER — FLUCONAZOLE 200 MG PO TABS
200.0000 mg | ORAL_TABLET | Freq: Every day | ORAL | 0 refills | Status: DC
Start: 2022-01-10 — End: 2023-07-11

## 2022-01-10 MED ORDER — CABOTEGRAVIR & RILPIVIRINE ER 400 & 600 MG/2ML IM SUER
1.0000 | Freq: Once | INTRAMUSCULAR | Status: AC
  Administered 2022-01-10: 1 via INTRAMUSCULAR

## 2022-01-10 NOTE — Progress Notes (Signed)
HPI: Jose Olsen is a 36 y.o. male who presents to the [place] clinic for [place] administration.  Patient Active Problem List   Diagnosis Date Noted   Injection education, encounter for 10/13/2020   Healthcare maintenance 09/15/2020   Screening for STDs (sexually transmitted diseases) 06/16/2020   Medication monitoring encounter 11/04/2019   HTN (hypertension) 05/07/2019   Screening examination for venereal disease 03/28/2016   Encounter for long-term (current) use of high-risk medication 03/28/2016   Human immunodeficiency virus (HIV) disease ([place]) 12/02/2010   ONYCHOMYCOSIS 05/19/2009    [name] Medications  New Prescriptions   No medications on file  Previous Medications   CABOTEGRAVIR & RILPIVIRINE ER (CABENUVA) 400 & 600 MG/2ML INJECTION    Inject 1 kit into the muscle every 30 (thirty) days.   FLUCONAZOLE (DIFLUCAN) 200 MG TABLET    Take 1 tablet (200 mg total) by mouth daily.  Modified Medications   No medications on file  Discontinued Medications   No medications on file    Allergies: No Known Allergies  Past Medical History: Past Medical History:  Diagnosis Date   Chicken pox    HIV infection ([place])     Social History: Social History   Socioeconomic History   Marital status: Single    Spouse name: Not on file   Number of children: Not on file   Years of education: Not on file   Highest education level: Not on file  Occupational History   Not on file  Tobacco Use   Smoking status: Never   Smokeless tobacco: Never  Substance and Sexual Activity   Alcohol use: Yes    Comment: rarely   Drug use: No    Frequency: 3.0 times per week   Sexual activity: Yes    Partners: Male    Birth control/protection: Condom    Comment: given condoms  Other Topics Concern   Not on file  Social History Narrative   Occupation:  Unemployed   Never Smoked   Alcohol use-yes   Regular exercise-no   Social Determinants of Health   Financial Resource  Strain: Not on file  Food Insecurity: Not on file  Transportation Needs: Not on file  Physical Activity: Not on file  Stress: Not on file  Social Connections: Not on file    Labs: Lab Results  Component Value Date   HIV1RNAQUANT <20 (H) 12/09/2021   HIV1RNAQUANT NOT DETECTED 08/12/2021   HIV1RNAQUANT Not Detected 05/17/2021   CD4TABS 753 08/12/2021   CD4TABS 655 12/16/2020   CD4TABS 631 06/16/2020    RPR and STI Lab Results  Component Value Date   LABRPR NON-REACTIVE 08/12/2021   LABRPR NON-REACTIVE 12/16/2020   LABRPR NON-REACTIVE 06/16/2020   LABRPR NON-REACTIVE 05/07/2019   LABRPR NON-REACTIVE 08/14/2018    STI Results GC GC CT CT  Latest Ref Rng & Units  NEGATIVE  NEGATIVE  08/12/2021 10:14 AM Negative    Negative    Negative   Negative    Negative    Negative    02/15/2021  9:25 AM Negative   Negative    06/16/2020 10:36 AM Negative    Negative    Negative   Negative    Negative    Negative    05/07/2019  9:59 AM Negative    Negative    Negative   Negative    Negative    Negative    08/14/2018 12:00 AM Negative   Negative    10/10/2013  2:34 AM  POSITIVE  NEGATIVE     Hepatitis B Lab Results  Component Value Date   HEPBSAB REACTIVE (A) 12/16/2020   HEPBSAG NEGATIVE 11/11/2010   Hepatitis C Lab Results  Component Value Date   HEPCAB NON-REACTIVE 12/16/2020   Hepatitis A Lab Results  Component Value Date   HAV REACTIVE (A) 12/16/2020   Lipids: Lab Results  Component Value Date   CHOL 194 08/12/2021   TRIG 157 (H) 08/12/2021   HDL 39 (L) 08/12/2021   CHOLHDL 5.0 (H) 08/12/2021   VLDL 19 12/14/2012   LDLCALC 128 (H) 08/12/2021    TARGET DATE: The 26th  Assessment: [name] presents today for his maintenance Cabenuva injections. Past injections were tolerated well without issues except for mild pain initially after on the right side. Politely declines STI testing but is willing to get updated COVID vaccine today.  Administered  cabotegravir 457m/2mL in left upper outer quadrant of the gluteal muscle. Administered rilpivirine 600 mg/28min the right upper outer quadrant of the gluteal muscle. No issues with injections. He will follow up in one month for next set of injections.  Plan: - Cabenuva injections administered - Administered updated COVID vaccine today - Next injections scheduled for 02/08/22 with Dr. CoLinus Salmonsand 03/08/22, 04/11/22, 05/10/22, and 06/07/22 with me - Call with any issues or questions  Montrez Marietta L. Kerryn Tennant, PharmD, BCIDP, AAHIVP, CPOakdalelinical Pharmacist Practitioner [facility]

## 2022-02-08 ENCOUNTER — Encounter: Payer: Commercial Managed Care - HMO | Admitting: Internal Medicine

## 2022-02-10 NOTE — Progress Notes (Signed)
HPI: Jose Olsen is a 36 y.o. male who presents to the [place] clinic for [place] administration.  Patient Active Problem List   Diagnosis Date Noted   Injection education, encounter for 10/13/2020   Healthcare maintenance 09/15/2020   Screening for STDs (sexually transmitted diseases) 06/16/2020   Medication monitoring encounter 11/04/2019   HTN (hypertension) 05/07/2019   Screening examination for venereal disease 03/28/2016   Encounter for long-term (current) use of high-risk medication 03/28/2016   Human immunodeficiency virus (HIV) disease ([place]) 12/02/2010   ONYCHOMYCOSIS 05/19/2009    [name] Medications  New Prescriptions   No medications on file  Previous Medications   CABOTEGRAVIR & RILPIVIRINE ER (CABENUVA) 400 & 600 MG/2ML INJECTION    Inject 1 kit into the muscle every 30 (thirty) days.   FLUCONAZOLE (DIFLUCAN) 200 MG TABLET    Take 1 tablet (200 mg total) by mouth daily.  Modified Medications   No medications on file  Discontinued Medications   No medications on file    Allergies: No Known Allergies  Past Medical History: Past Medical History:  Diagnosis Date   Chicken pox    HIV infection ([place])     Social History: Social History   Socioeconomic History   Marital status: Single    Spouse name: Not on file   Number of children: Not on file   Years of education: Not on file   Highest education level: Not on file  Occupational History   Not on file  Tobacco Use   Smoking status: Never   Smokeless tobacco: Never  Substance and Sexual Activity   Alcohol use: Yes    Comment: rarely   Drug use: No    Frequency: 3.0 times per week   Sexual activity: Yes    Partners: Male    Birth control/protection: Condom    Comment: given condoms  Other Topics Concern   Not on file  Social History Narrative   Occupation:  Unemployed   Never Smoked   Alcohol use-yes   Regular exercise-no   Social Determinants of Health   Financial Resource  Strain: Not on file  Food Insecurity: Not on file  Transportation Needs: Not on file  Physical Activity: Not on file  Stress: Not on file  Social Connections: Not on file    Labs: Lab Results  Component Value Date   HIV1RNAQUANT <20 (H) 12/09/2021   HIV1RNAQUANT NOT DETECTED 08/12/2021   HIV1RNAQUANT Not Detected 05/17/2021   CD4TABS 753 08/12/2021   CD4TABS 655 12/16/2020   CD4TABS 631 06/16/2020    RPR and STI Lab Results  Component Value Date   LABRPR NON-REACTIVE 08/12/2021   LABRPR NON-REACTIVE 12/16/2020   LABRPR NON-REACTIVE 06/16/2020   LABRPR NON-REACTIVE 05/07/2019   LABRPR NON-REACTIVE 08/14/2018    STI Results GC GC CT CT  Latest Ref Rng & Units  NEGATIVE  NEGATIVE  08/12/2021 10:14 AM Negative    Negative    Negative   Negative    Negative    Negative    02/15/2021  9:25 AM Negative   Negative    06/16/2020 10:36 AM Negative    Negative    Negative   Negative    Negative    Negative    05/07/2019  9:59 AM Negative    Negative    Negative   Negative    Negative    Negative    08/14/2018 12:00 AM Negative   Negative    10/10/2013  2:34 AM  POSITIVE  NEGATIVE     Hepatitis B Lab Results  Component Value Date   HEPBSAB REACTIVE (A) 12/16/2020   HEPBSAG NEGATIVE 11/11/2010   Hepatitis C Lab Results  Component Value Date   HEPCAB NON-REACTIVE 12/16/2020   Hepatitis A Lab Results  Component Value Date   HAV REACTIVE (A) 12/16/2020   Lipids: Lab Results  Component Value Date   CHOL 194 08/12/2021   TRIG 157 (H) 08/12/2021   HDL 39 (L) 08/12/2021   CHOLHDL 5.0 (H) 08/12/2021   VLDL 19 12/14/2012   LDLCALC 128 (H) 08/12/2021    TARGET DATE: The 26th of the month  Assessment: Jose Olsen presents today for his maintenance Cabenuva injections. Past injections were tolerated well without issues. He was offered STI screening and condoms which we deferred at this time since he has been with the same partner for a year and both he and  his partner are monogamous . He also stated he had condoms from last time.  He is eligible for the Mpox vaccine. After discussing the risks and benefits, he declined the vaccine at this time.  Administered cabotegravir 434m/2mL in left upper outer quadrant of the gluteal muscle. Administered rilpivirine 600 mg/238min the right upper outer quadrant of the gluteal muscle. No issues with injections. He will follow up in 1 month for next set of injections.  Plan: - Cabenuva injections administered - Collect HIV viral load today - Next injections scheduled for 03/08/2022 with [name]  - Following injections scheduled on 04/19/2022 at 10:30 Am with Dr. CoLinus Salmonss well as 05/10/2022 with [name] and and 06/07/2022 with AmEstill Bamberg Call with any issues or questions  [facility] Infectious [facility]

## 2022-02-14 ENCOUNTER — Ambulatory Visit (INDEPENDENT_AMBULATORY_CARE_PROVIDER_SITE_OTHER): Payer: Commercial Managed Care - HMO | Admitting: Pharmacist

## 2022-02-14 ENCOUNTER — Other Ambulatory Visit: Payer: Self-pay

## 2022-02-14 DIAGNOSIS — B20 Human immunodeficiency virus [HIV] disease: Secondary | ICD-10-CM

## 2022-02-14 MED ORDER — CABOTEGRAVIR & RILPIVIRINE ER 400 & 600 MG/2ML IM SUER
1.0000 | Freq: Once | INTRAMUSCULAR | Status: AC
  Administered 2022-02-14: 1 via INTRAMUSCULAR

## 2022-02-16 LAB — HIV-1 RNA QUANT-NO REFLEX-BLD
HIV 1 RNA Quant: NOT DETECTED Copies/mL
HIV-1 RNA Quant, Log: NOT DETECTED Log cps/mL

## 2022-02-18 ENCOUNTER — Other Ambulatory Visit: Payer: Self-pay | Admitting: Pharmacist

## 2022-02-18 DIAGNOSIS — B20 Human immunodeficiency virus [HIV] disease: Secondary | ICD-10-CM

## 2022-02-18 MED ORDER — CABOTEGRAVIR & RILPIVIRINE ER 400 & 600 MG/2ML IM SUER: 1.0000 | INTRAMUSCULAR | 11 refills | Status: DC

## 2022-02-18 NOTE — Progress Notes (Signed)
Per Donna, [facility] requiring printed script faxed to them.  Jaloni Sorber, PharmD, CPP, BCIDP, AAHIVP Clinical Pharmacist Practitioner Infectious Diseases Clinical Pharmacist [facility]  

## 2022-03-02 ENCOUNTER — Telehealth: Payer: Self-pay

## 2022-03-02 NOTE — Telephone Encounter (Signed)
[name] Patient Advocate Encounter  [name] medications have been couriered to [name] from [facility]: [phone] , and will be administered on  12/19/023.

## 2022-03-08 ENCOUNTER — Other Ambulatory Visit: Payer: Self-pay

## 2022-03-08 ENCOUNTER — Ambulatory Visit (INDEPENDENT_AMBULATORY_CARE_PROVIDER_SITE_OTHER): Payer: Commercial Managed Care - HMO | Admitting: Pharmacist

## 2022-03-08 DIAGNOSIS — B20 Human immunodeficiency virus [HIV] disease: Secondary | ICD-10-CM | POA: Diagnosis not present

## 2022-03-08 MED ORDER — CABOTEGRAVIR & RILPIVIRINE ER 400 & 600 MG/2ML IM SUER
1.0000 | Freq: Once | INTRAMUSCULAR | Status: AC
  Administered 2022-03-08: 1 via INTRAMUSCULAR

## 2022-03-08 NOTE — Progress Notes (Signed)
HPI: Jose Olsen is a 36 y.o. male who presents to the [place] clinic for [place] administration.  Patient Active Problem List   Diagnosis Date Noted   Injection education, encounter for 10/13/2020   Healthcare maintenance 09/15/2020   Screening for STDs (sexually transmitted diseases) 06/16/2020   Medication monitoring encounter 11/04/2019   HTN (hypertension) 05/07/2019   Screening examination for venereal disease 03/28/2016   Encounter for long-term (current) use of high-risk medication 03/28/2016   Human immunodeficiency virus (HIV) disease ([place]) 12/02/2010   ONYCHOMYCOSIS 05/19/2009    [name] Medications  New Prescriptions   No medications on file  Previous Medications   CABOTEGRAVIR & RILPIVIRINE ER (CABENUVA) 400 & 600 MG/2ML INJECTION    Inject 1 kit into the muscle every 30 (thirty) days.   FLUCONAZOLE (DIFLUCAN) 200 MG TABLET    Take 1 tablet (200 mg total) by mouth daily.  Modified Medications   No medications on file  Discontinued Medications   No medications on file    Allergies: No Known Allergies  Past Medical History: Past Medical History:  Diagnosis Date   Chicken pox    HIV infection ([place])     Social History: Social History   Socioeconomic History   Marital status: Single    Spouse name: Not on file   Number of children: Not on file   Years of education: Not on file   Highest education level: Not on file  Occupational History   Not on file  Tobacco Use   Smoking status: Never   Smokeless tobacco: Never  Substance and Sexual Activity   Alcohol use: Yes    Comment: rarely   Drug use: No    Frequency: 3.0 times per week   Sexual activity: Yes    Partners: Male    Birth control/protection: Condom    Comment: given condoms  Other Topics Concern   Not on file  Social History Narrative   Occupation:  Unemployed   Never Smoked   Alcohol use-yes   Regular exercise-no   Social Determinants of Health   Financial Resource  Strain: Not on file  Food Insecurity: Not on file  Transportation Needs: Not on file  Physical Activity: Not on file  Stress: Not on file  Social Connections: Not on file    Labs: Lab Results  Component Value Date   HIV1RNAQUANT Not Detected 02/14/2022   HIV1RNAQUANT <20 (H) 12/09/2021   HIV1RNAQUANT NOT DETECTED 08/12/2021   CD4TABS 753 08/12/2021   CD4TABS 655 12/16/2020   CD4TABS 631 06/16/2020    RPR and STI Lab Results  Component Value Date   LABRPR NON-REACTIVE 08/12/2021   LABRPR NON-REACTIVE 12/16/2020   LABRPR NON-REACTIVE 06/16/2020   LABRPR NON-REACTIVE 05/07/2019   LABRPR NON-REACTIVE 08/14/2018    STI Results GC GC CT CT  Latest Ref Rng & Units  NEGATIVE  NEGATIVE  08/12/2021 10:14 AM Negative    Negative    Negative   Negative    Negative    Negative    02/15/2021  9:25 AM Negative   Negative    06/16/2020 10:36 AM Negative    Negative    Negative   Negative    Negative    Negative    05/07/2019  9:59 AM Negative    Negative    Negative   Negative    Negative    Negative    08/14/2018 12:00 AM Negative   Negative    10/10/2013  2:34 AM  POSITIVE  NEGATIVE     Hepatitis B Lab Results  Component Value Date   HEPBSAB REACTIVE (A) 12/16/2020   HEPBSAG NEGATIVE 11/11/2010   Hepatitis C Lab Results  Component Value Date   HEPCAB NON-REACTIVE 12/16/2020   Hepatitis A Lab Results  Component Value Date   HAV REACTIVE (A) 12/16/2020   Lipids: Lab Results  Component Value Date   CHOL 194 08/12/2021   TRIG 157 (H) 08/12/2021   HDL 39 (L) 08/12/2021   CHOLHDL 5.0 (H) 08/12/2021   VLDL 19 12/14/2012   LDLCALC 128 (H) 08/12/2021    TARGET DATE:  The 26th of the month  Current HIV Regimen: Cabenuva  Assessment: Daeton presents today for their maintenance Cabenuva injections. Initial/past injections were tolerated well without issues. No problems with systemic effects of injections.   Administered cabotegravir 400 mg/69m in left  upper outer quadrant of the gluteal muscle. Administered rilpivirine 600 mg/217min the right upper outer quadrant of the gluteal muscle. Monitored patient for 10 minutes after injection. Injections were tolerated well without issue. Patient will follow up in 2 months for next injection. Will defer HIV RNA today as checked at his last visit. He denies any new sexual partners or activity and politely declines STI screening.   Plan: - Cabenuva injections administered - Next injections scheduled for 1/30 with Dr. CoLinus Salmons2/20 with [name], and 3/19 with me  - Call with any issues or questions  AmAlfonse SprucePharmD, CPP, BCFredoniaAAClintonharmacist Practitioner Infectious [facility]

## 2022-03-22 ENCOUNTER — Telehealth: Payer: Self-pay

## 2022-03-22 NOTE — Telephone Encounter (Signed)
Sending mychart msg. AS, CMA 

## 2022-04-11 ENCOUNTER — Encounter: Payer: Self-pay | Admitting: Pharmacist

## 2022-04-18 ENCOUNTER — Other Ambulatory Visit: Payer: Self-pay | Admitting: Pharmacist

## 2022-04-18 ENCOUNTER — Other Ambulatory Visit: Payer: Self-pay

## 2022-04-18 ENCOUNTER — Other Ambulatory Visit (HOSPITAL_COMMUNITY): Payer: Self-pay

## 2022-04-18 DIAGNOSIS — B20 Human immunodeficiency virus [HIV] disease: Secondary | ICD-10-CM

## 2022-04-18 MED ORDER — CABOTEGRAVIR & RILPIVIRINE ER 400 & 600 MG/2ML IM SUER
1.0000 | INTRAMUSCULAR | 11 refills | Status: DC
  Filled 2022-04-18 (×2): qty 4, 30d supply, fill #0
  Filled 2022-05-05 – 2022-05-10 (×2): qty 4, 30d supply, fill #1
  Filled 2022-05-30: qty 4, 30d supply, fill #2
  Filled 2022-07-01: qty 4, 30d supply, fill #3
  Filled 2022-07-26: qty 4, 30d supply, fill #4
  Filled 2022-08-26: qty 4, 30d supply, fill #5
  Filled 2022-10-06: qty 4, 30d supply, fill #6
  Filled 2022-11-01: qty 4, 30d supply, fill #7
  Filled 2022-12-05: qty 4, 30d supply, fill #8
  Filled 2022-12-27: qty 4, 30d supply, fill #9
  Filled 2023-02-03: qty 4, 30d supply, fill #10
  Filled 2023-02-17 (×3): qty 4, 30d supply, fill #11

## 2022-04-19 ENCOUNTER — Encounter: Payer: Self-pay | Admitting: Internal Medicine

## 2022-04-19 ENCOUNTER — Other Ambulatory Visit: Payer: Self-pay

## 2022-04-19 ENCOUNTER — Ambulatory Visit: Payer: Medicaid Other | Admitting: Internal Medicine

## 2022-04-19 VITALS — BP 127/87 | HR 81 | Temp 97.9°F | Ht 70.0 in | Wt 190.0 lb

## 2022-04-19 DIAGNOSIS — B20 Human immunodeficiency virus [HIV] disease: Secondary | ICD-10-CM | POA: Diagnosis not present

## 2022-04-19 DIAGNOSIS — Z113 Encounter for screening for infections with a predominantly sexual mode of transmission: Secondary | ICD-10-CM

## 2022-04-19 MED ORDER — CABOTEGRAVIR & RILPIVIRINE ER 400 & 600 MG/2ML IM SUER
1.0000 | Freq: Once | INTRAMUSCULAR | Status: AC
  Administered 2022-04-19: 1 via INTRAMUSCULAR

## 2022-04-19 NOTE — Progress Notes (Signed)
   Subjective:    Patient ID: Jose Olsen, male    DOB: 08/31/1985, 37 y.o.   MRN: [id number]  HPI Here for follow up of HIV He continues on Cabenuva and no significant issues.  Still prefers the monthly injection.  No issues with it.  Some sexual activity with protection.  No concerns today.   Review of Systems  Constitutional:  Negative for fatigue.  Gastrointestinal:  Negative for diarrhea.  Skin:  Negative for rash.       Objective:   Physical Exam Eyes:     General: No scleral icterus. Pulmonary:     Effort: Pulmonary effort is normal.  Skin:    Findings: No rash.  Neurological:     Mental Status: He is alert.   SH: no tobacco        Assessment & Plan:

## 2022-04-19 NOTE — Assessment & Plan Note (Addendum)
He continues to do well and previous labs reviewed.  He can return in 1 month with pharmacy  I discussed anal pap and will need to screen - plan to do that with his next visit with me in 6 months.   I have personally spent 30 minutes involved in face-to-face and non-face-to-face activities for this patient on the day of the visit. Professional time spent includes the following activities: Preparing to see the patient (review of tests), Obtaining and/or reviewing separately obtained history (admission/discharge record), Performing a medically appropriate examination and/or evaluation , Ordering medications/tests/procedures, referring and communicating with other health care professionals, Documenting clinical information in the EMR, Independently interpreting results (not separately reported), Communicating results to the patient/family/caregiver, Counseling and educating the patient/family/caregiver and Care coordination (not separately reported).

## 2022-04-19 NOTE — Assessment & Plan Note (Signed)
He deferred screening for now

## 2022-04-20 ENCOUNTER — Telehealth: Payer: Self-pay

## 2022-04-20 NOTE — Telephone Encounter (Signed)
[name] Patient Advocate Encounter  [name] medications have been couriered to [name] from [facility]: [phone] , and will be administered on  05/10/2022.

## 2022-04-21 ENCOUNTER — Other Ambulatory Visit (HOSPITAL_COMMUNITY): Payer: Self-pay

## 2022-05-05 ENCOUNTER — Other Ambulatory Visit: Payer: Self-pay

## 2022-05-05 ENCOUNTER — Other Ambulatory Visit (HOSPITAL_COMMUNITY): Payer: Self-pay

## 2022-05-06 ENCOUNTER — Other Ambulatory Visit: Payer: Self-pay

## 2022-05-09 ENCOUNTER — Other Ambulatory Visit (HOSPITAL_COMMUNITY): Payer: Self-pay

## 2022-05-10 ENCOUNTER — Other Ambulatory Visit (HOSPITAL_COMMUNITY): Payer: Self-pay

## 2022-05-10 ENCOUNTER — Ambulatory Visit (INDEPENDENT_AMBULATORY_CARE_PROVIDER_SITE_OTHER): Payer: Medicaid Other | Admitting: Pharmacist

## 2022-05-10 ENCOUNTER — Other Ambulatory Visit: Payer: Self-pay

## 2022-05-10 DIAGNOSIS — B20 Human immunodeficiency virus [HIV] disease: Secondary | ICD-10-CM | POA: Diagnosis present

## 2022-05-10 MED ORDER — CABOTEGRAVIR & RILPIVIRINE ER 400 & 600 MG/2ML IM SUER
1.0000 | Freq: Once | INTRAMUSCULAR | Status: AC
  Administered 2022-05-10: 1 via INTRAMUSCULAR

## 2022-05-10 NOTE — Progress Notes (Addendum)
HPI: Jose Olsen is a 37 y.o. male who presents to the [place] clinic for [place] administration.  Patient Active Problem List   Diagnosis Date Noted   Injection education, encounter for 10/13/2020   Healthcare maintenance 09/15/2020   Screening for STDs (sexually transmitted diseases) 06/16/2020   Medication monitoring encounter 11/04/2019   HTN (hypertension) 05/07/2019   Screening examination for venereal disease 03/28/2016   Encounter for long-term (current) use of high-risk medication 03/28/2016   Human immunodeficiency virus (HIV) disease ([place]) 12/02/2010   ONYCHOMYCOSIS 05/19/2009    [name] Medications  New Prescriptions   No medications on file  Previous Medications   CABOTEGRAVIR & RILPIVIRINE ER (CABENUVA) 400 & 600 MG/2ML INJECTION    Inject 1 kit into the muscle every 30 (thirty) days.   FLUCONAZOLE (DIFLUCAN) 200 MG TABLET    Take 1 tablet (200 mg total) by mouth daily.  Modified Medications   No medications on file  Discontinued Medications   No medications on file    Allergies: No Known Allergies  Past Medical History: Past Medical History:  Diagnosis Date   Chicken pox    HIV infection ([place])     Social History: Social History   Socioeconomic History   Marital status: Single    Spouse name: Not on file   Number of children: Not on file   Years of education: Not on file   Highest education level: Not on file  Occupational History   Not on file  Tobacco Use   Smoking status: Never   Smokeless tobacco: Never  Substance and Sexual Activity   Alcohol use: Yes    Comment: rarely   Drug use: No    Frequency: 3.0 times per week   Sexual activity: Yes    Partners: Male    Birth control/protection: Condom    Comment: given condoms  Other Topics Concern   Not on file  Social History Narrative   Occupation:  Unemployed   Never Smoked   Alcohol use-yes   Regular exercise-no   Social Determinants of Health   Financial Resource  Strain: Not on file  Food Insecurity: Not on file  Transportation Needs: Not on file  Physical Activity: Not on file  Stress: Not on file  Social Connections: Not on file    Labs: Lab Results  Component Value Date   HIV1RNAQUANT Not Detected 02/14/2022   HIV1RNAQUANT <20 (H) 12/09/2021   HIV1RNAQUANT NOT DETECTED 08/12/2021   CD4TABS 753 08/12/2021   CD4TABS 655 12/16/2020   CD4TABS 631 06/16/2020    RPR and STI Lab Results  Component Value Date   LABRPR NON-REACTIVE 08/12/2021   LABRPR NON-REACTIVE 12/16/2020   LABRPR NON-REACTIVE 06/16/2020   LABRPR NON-REACTIVE 05/07/2019   LABRPR NON-REACTIVE 08/14/2018    STI Results GC GC CT CT  Latest Ref Rng & Units  NEGATIVE  NEGATIVE  08/12/2021 10:14 AM Negative    Negative    Negative   Negative    Negative    Negative    02/15/2021  9:25 AM Negative   Negative    06/16/2020 10:36 AM Negative    Negative    Negative   Negative    Negative    Negative    05/07/2019  9:59 AM Negative    Negative    Negative   Negative    Negative    Negative    08/14/2018 12:00 AM Negative   Negative    10/10/2013  2:34 AM  POSITIVE  NEGATIVE     Hepatitis B Lab Results  Component Value Date   HEPBSAB REACTIVE (A) 12/16/2020   HEPBSAG NEGATIVE 11/11/2010   Hepatitis C Lab Results  Component Value Date   HEPCAB NON-REACTIVE 12/16/2020   Hepatitis A Lab Results  Component Value Date   HAV REACTIVE (A) 12/16/2020   Lipids: Lab Results  Component Value Date   CHOL 194 08/12/2021   TRIG 157 (H) 08/12/2021   HDL 39 (L) 08/12/2021   CHOLHDL 5.0 (H) 08/12/2021   VLDL 19 12/14/2012   LDLCALC 128 (H) 08/12/2021    TARGET DATE: 26th  Assessment: Jose Olsen presents today for monthly maintenance Cabenuva injections. Past injections were tolerated well without issues. He is not interested in extending to the 2 month dose of Cabenuva. He is eligible for the mpox vaccine. Politely declines vaccinations and STI testing today.    Administered cabotegravir 61m/2mL in left upper outer quadrant of the gluteal muscle. Administered rilpivirine 400 mg/213min the right upper outer quadrant of the gluteal muscle. No issues with injections. He will follow up in 1 month for next set of injections.  Plan: - Cabenuva injections administered - Next injections scheduled for 06/07/22 with Jose Bambergnd 07/12/22 with Jose Olsen - Call with any issues or questions  Jose DubinPharmD PGY1 Pharmacy Resident 05/10/2022 10:48 AM

## 2022-05-11 ENCOUNTER — Other Ambulatory Visit: Payer: Self-pay

## 2022-05-11 ENCOUNTER — Other Ambulatory Visit (HOSPITAL_COMMUNITY): Payer: Self-pay

## 2022-05-30 ENCOUNTER — Other Ambulatory Visit (HOSPITAL_COMMUNITY): Payer: Self-pay

## 2022-05-31 ENCOUNTER — Other Ambulatory Visit (HOSPITAL_COMMUNITY): Payer: Self-pay

## 2022-06-01 ENCOUNTER — Other Ambulatory Visit: Payer: Self-pay

## 2022-06-02 ENCOUNTER — Other Ambulatory Visit (HOSPITAL_COMMUNITY): Payer: Self-pay

## 2022-06-03 ENCOUNTER — Other Ambulatory Visit (HOSPITAL_COMMUNITY): Payer: Self-pay

## 2022-06-06 ENCOUNTER — Telehealth: Payer: Self-pay

## 2022-06-06 NOTE — Telephone Encounter (Signed)
[name] Patient Advocate Encounter  [name] medication (Cabenuva-monthly) have been couriered to [name] from [facility] and will be administered on the patient next office visit on 06/07/22.  Jose Olsen , [facility] Patient [facility] Phone: [phone] Fax:  [phone]

## 2022-06-07 ENCOUNTER — Other Ambulatory Visit: Payer: Self-pay

## 2022-06-07 ENCOUNTER — Ambulatory Visit (INDEPENDENT_AMBULATORY_CARE_PROVIDER_SITE_OTHER): Payer: Medicaid Other | Admitting: Pharmacist

## 2022-06-07 DIAGNOSIS — B20 Human immunodeficiency virus [HIV] disease: Secondary | ICD-10-CM | POA: Diagnosis not present

## 2022-06-07 MED ORDER — CABOTEGRAVIR & RILPIVIRINE ER 400 & 600 MG/2ML IM SUER
1.0000 | Freq: Once | INTRAMUSCULAR | Status: AC
  Administered 2022-06-07: 1 via INTRAMUSCULAR

## 2022-06-07 NOTE — Progress Notes (Signed)
HPI: Jose Olsen is a 37 y.o. male who presents to the [facility] for [place] administration.  Patient Active Problem List   Diagnosis Date Noted   Injection education, encounter for 10/13/2020   Healthcare maintenance 09/15/2020   Screening for STDs (sexually transmitted diseases) 06/16/2020   Medication monitoring encounter 11/04/2019   HTN (hypertension) 05/07/2019   Screening examination for venereal disease 03/28/2016   Encounter for long-term (current) use of high-risk medication 03/28/2016   Human immunodeficiency virus (HIV) disease ([place]) 12/02/2010   ONYCHOMYCOSIS 05/19/2009    [name] Medications  New Prescriptions   No medications on file  Previous Medications   CABOTEGRAVIR & RILPIVIRINE ER (CABENUVA) 400 & 600 MG/2ML INJECTION    Inject 1 kit into the muscle every 30 (thirty) days.   FLUCONAZOLE (DIFLUCAN) 200 MG TABLET    Take 1 tablet (200 mg total) by mouth daily.  Modified Medications   No medications on file  Discontinued Medications   No medications on file    Allergies: No Known Allergies  Past Medical History: Past Medical History:  Diagnosis Date   Chicken pox    HIV infection ([place])     Social History: Social History   Socioeconomic History   Marital status: Single    Spouse name: Not on file   Number of children: Not on file   Years of education: Not on file   Highest education level: Not on file  Occupational History   Not on file  Tobacco Use   Smoking status: Never   Smokeless tobacco: Never  Substance and Sexual Activity   Alcohol use: Yes    Comment: rarely   Drug use: No    Frequency: 3.0 times per week   Sexual activity: Yes    Partners: Male    Birth control/protection: Condom    Comment: given condoms  Other Topics Concern   Not on file  Social History Narrative   Occupation:  Unemployed   Never Smoked   Alcohol use-yes   Regular exercise-no   Social Determinants of Health   Financial Resource  Strain: Not on file  Food Insecurity: Not on file  Transportation Needs: Not on file  Physical Activity: Not on file  Stress: Not on file  Social Connections: Not on file    Labs: Lab Results  Component Value Date   HIV1RNAQUANT Not Detected 02/14/2022   HIV1RNAQUANT <20 (H) 12/09/2021   HIV1RNAQUANT NOT DETECTED 08/12/2021   CD4TABS 753 08/12/2021   CD4TABS 655 12/16/2020   CD4TABS 631 06/16/2020    RPR and STI Lab Results  Component Value Date   LABRPR NON-REACTIVE 08/12/2021   LABRPR NON-REACTIVE 12/16/2020   LABRPR NON-REACTIVE 06/16/2020   LABRPR NON-REACTIVE 05/07/2019   LABRPR NON-REACTIVE 08/14/2018    STI Results GC GC CT CT  Latest Ref Rng & Units  NEGATIVE  NEGATIVE  08/12/2021 10:14 AM Negative    Negative    Negative   Negative    Negative    Negative    02/15/2021  9:25 AM Negative   Negative    06/16/2020 10:36 AM Negative    Negative    Negative   Negative    Negative    Negative    05/07/2019  9:59 AM Negative    Negative    Negative   Negative    Negative    Negative    08/14/2018 12:00 AM Negative   Negative    10/10/2013  2:34 AM  POSITIVE  NEGATIVE     Hepatitis B Lab Results  Component Value Date   HEPBSAB REACTIVE (A) 12/16/2020   HEPBSAG NEGATIVE 11/11/2010   Hepatitis C Lab Results  Component Value Date   HEPCAB NON-REACTIVE 12/16/2020   Hepatitis A Lab Results  Component Value Date   HAV REACTIVE (A) 12/16/2020   Lipids: Lab Results  Component Value Date   CHOL 194 08/12/2021   TRIG 157 (H) 08/12/2021   HDL 39 (L) 08/12/2021   CHOLHDL 5.0 (H) 08/12/2021   VLDL 19 12/14/2012   LDLCALC 128 (H) 08/12/2021    TARGET DATE: The 26th   Assessment: Jose Olsen presents today for his monthly maintenance Cabenuva injections. Past injections were tolerated well without issues. He is eligible for the monkeypox vaccine but politely declines today. Patient also declines STI testing. Will get HIV RNA today.   Administered  cabotegravir 600mg /41mL in left upper outer quadrant of the gluteal muscle. Administered rilpivirine 400 mg/29mL in the right upper outer quadrant of the gluteal muscle. No issues with injections. He will follow up in 1 month for next set of injections.  Plan: - Cabenuva injections administered - Get HIV RNA  - Next injections scheduled for 07/12/22 with [name] - Call with any issues or questions   Jose Olsen, PharmD PGY1 Pharmacy Resident 3/19/202410:24 AM

## 2022-06-10 LAB — HIV-1 RNA QUANT-NO REFLEX-BLD
HIV 1 RNA Quant: NOT DETECTED Copies/mL
HIV-1 RNA Quant, Log: NOT DETECTED Log cps/mL

## 2022-07-01 ENCOUNTER — Other Ambulatory Visit (HOSPITAL_COMMUNITY): Payer: Self-pay

## 2022-07-04 ENCOUNTER — Other Ambulatory Visit (HOSPITAL_COMMUNITY): Payer: Self-pay

## 2022-07-06 ENCOUNTER — Other Ambulatory Visit: Payer: Self-pay

## 2022-07-12 ENCOUNTER — Other Ambulatory Visit: Payer: Self-pay

## 2022-07-12 ENCOUNTER — Ambulatory Visit (INDEPENDENT_AMBULATORY_CARE_PROVIDER_SITE_OTHER): Payer: Medicaid Other | Admitting: Pharmacist

## 2022-07-12 DIAGNOSIS — B20 Human immunodeficiency virus [HIV] disease: Secondary | ICD-10-CM

## 2022-07-12 MED ORDER — CABOTEGRAVIR & RILPIVIRINE ER 400 & 600 MG/2ML IM SUER
1.0000 | Freq: Once | INTRAMUSCULAR | Status: AC
  Administered 2022-07-12: 1 via INTRAMUSCULAR

## 2022-07-12 NOTE — Progress Notes (Signed)
HPI: Jose Olsen is a 37 y.o. male who presents to the [name] pharmacy clinic for [place] administration.  Patient Active Problem List   Diagnosis Date Noted   Injection education, encounter for 10/13/2020   Healthcare maintenance 09/15/2020   Screening for STDs (sexually transmitted diseases) 06/16/2020   Medication monitoring encounter 11/04/2019   HTN (hypertension) 05/07/2019   Screening examination for venereal disease 03/28/2016   Encounter for long-term (current) use of high-risk medication 03/28/2016   Human immunodeficiency virus (HIV) disease 12/02/2010   ONYCHOMYCOSIS 05/19/2009    [name] Medications  New Prescriptions   No medications on file  Previous Medications   CABOTEGRAVIR & RILPIVIRINE ER (CABENUVA) 400 & 600 MG/2ML INJECTION    Inject 1 kit into the muscle every 30 (thirty) days.   FLUCONAZOLE (DIFLUCAN) 200 MG TABLET    Take 1 tablet (200 mg total) by mouth daily.  Modified Medications   No medications on file  Discontinued Medications   No medications on file    Allergies: No Known Allergies  Past Medical History: Past Medical History:  Diagnosis Date   Chicken pox    HIV infection ([facility])     Social History: Social History   Socioeconomic History   Marital status: Single    Spouse name: Not on file   Number of children: Not on file   Years of education: Not on file   Highest education level: Not on file  Occupational History   Not on file  Tobacco Use   Smoking status: Never   Smokeless tobacco: Never  Substance and Sexual Activity   Alcohol use: Yes    Comment: rarely   Drug use: No    Frequency: 3.0 times per week   Sexual activity: Yes    Partners: Male    Birth control/protection: Condom    Comment: given condoms  Other Topics Concern   Not on file  Social History Narrative   Occupation:  Unemployed   Never Smoked   Alcohol use-yes   Regular exercise-no   Social Determinants of Health   Financial Resource Strain:  Not on file  Food Insecurity: Not on file  Transportation Needs: Not on file  Physical Activity: Not on file  Stress: Not on file  Social Connections: Not on file    Labs: Lab Results  Component Value Date   HIV1RNAQUANT Not Detected 06/07/2022   HIV1RNAQUANT Not Detected 02/14/2022   HIV1RNAQUANT <20 (H) 12/09/2021   CD4TABS 753 08/12/2021   CD4TABS 655 12/16/2020   CD4TABS 631 06/16/2020    RPR and STI Lab Results  Component Value Date   LABRPR NON-REACTIVE 08/12/2021   LABRPR NON-REACTIVE 12/16/2020   LABRPR NON-REACTIVE 06/16/2020   LABRPR NON-REACTIVE 05/07/2019   LABRPR NON-REACTIVE 08/14/2018    STI Results GC GC CT CT  Latest Ref Rng & Units  NEGATIVE  NEGATIVE  08/12/2021 10:14 AM Negative    Negative    Negative   Negative    Negative    Negative    02/15/2021  9:25 AM Negative   Negative    06/16/2020 10:36 AM Negative    Negative    Negative   Negative    Negative    Negative    05/07/2019  9:59 AM Negative    Negative    Negative   Negative    Negative    Negative    08/14/2018 12:00 AM Negative   Negative    10/10/2013  2:34 AM  POSITIVE  NEGATIVE     Hepatitis B Lab Results  Component Value Date   HEPBSAB REACTIVE (A) 12/16/2020   HEPBSAG NEGATIVE 11/11/2010   Hepatitis C Lab Results  Component Value Date   HEPCAB NON-REACTIVE 12/16/2020   Hepatitis A Lab Results  Component Value Date   HAV REACTIVE (A) 12/16/2020   Lipids: Lab Results  Component Value Date   CHOL 194 08/12/2021   TRIG 157 (H) 08/12/2021   HDL 39 (L) 08/12/2021   CHOLHDL 5.0 (H) 08/12/2021   VLDL 19 12/14/2012   LDLCALC 128 (H) 08/12/2021    TARGET DATE: The 26th  Assessment: Jose Olsen presents today for his maintenance Cabenuva injections. Past injections were tolerated well without issues. He would like to continue with the monthly dosing schedule and is not interested in moving to every 2 months.   Administered cabotegravir /1mL in left upper  outer quadrant of the gluteal muscle. Administered rilpivirine 400 mg/28mL in the right upper outer quadrant of the gluteal muscle. No issues with injections. He will follow up in 1 month for next set of injections.   He is up-to-date on his vaccines. His last HIV RNA was undetectable in March. He is starting a new job in the coming weeks and will eventually get new insurance. I asked him to let us know as soon as he gets his new insurance information.   He politely declines STI testing today. He is due to see Dr. Luciana Axe this summer so will coordinate his follows up to make sure that happens. 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 He will Mychart me with the information.  Plan: - Cabenuva injections administered - Next injections scheduled for 09/12/32 with me and 10/18/22 with  Dr. Luciana Axe - Schedule May's appointment once he gets his work schedule - Call with any issues or questions  Evanthia Maund L. Jadi Deyarmin, PharmD, BCIDP, AAHIVP, CPP Clinical Pharmacist Practitioner Infectious Diseases Clinical Pharmacist [facility]

## 2022-07-21 ENCOUNTER — Encounter: Payer: Self-pay | Admitting: Pharmacist

## 2022-07-26 ENCOUNTER — Other Ambulatory Visit (HOSPITAL_COMMUNITY): Payer: Self-pay

## 2022-08-02 ENCOUNTER — Other Ambulatory Visit (HOSPITAL_COMMUNITY): Payer: Self-pay

## 2022-08-04 ENCOUNTER — Telehealth: Payer: Self-pay

## 2022-08-04 NOTE — Telephone Encounter (Signed)
[name] Patient Advocate Encounter  [name] medication [name]) have been couriered to [name] from [employer] [employer] [facility] and will be administered on the patient next office visit on 08/08/22.  [name] , CPhT Specialty Pharmacy Patient [facility] for Infectious Disease Phone: [phone] Fax:  [phone]

## 2022-08-07 NOTE — Progress Notes (Signed)
HPI: Jose Olsen is a 37 y.o. male who presents to the [name] pharmacy clinic for [place] administration.  Patient Active Problem List   Diagnosis Date Noted   Injection education, encounter for 10/13/2020   Healthcare maintenance 09/15/2020   Screening for STDs (sexually transmitted diseases) 06/16/2020   Medication monitoring encounter 11/04/2019   HTN (hypertension) 05/07/2019   Screening examination for venereal disease 03/28/2016   Encounter for long-term (current) use of high-risk medication 03/28/2016   Human immunodeficiency virus (HIV) disease ([facility]) 12/02/2010   ONYCHOMYCOSIS 05/19/2009    [name] Medications  New Prescriptions   No medications on file  Previous Medications   CABOTEGRAVIR & RILPIVIRINE ER (CABENUVA) 400 & 600 MG/2ML INJECTION    Inject 1 kit into the muscle every 30 (thirty) days.   FLUCONAZOLE (DIFLUCAN) 200 MG TABLET    Take 1 tablet (200 mg total) by mouth daily.  Modified Medications   No medications on file  Discontinued Medications   No medications on file    Allergies: No Known Allergies  Past Medical History: Past Medical History:  Diagnosis Date   Chicken pox    HIV infection ([facility])     Social History: Social History   Socioeconomic History   Marital status: Single    Spouse name: Not on file   Number of children: Not on file   Years of education: Not on file   Highest education level: Not on file  Occupational History   Not on file  Tobacco Use   Smoking status: Never   Smokeless tobacco: Never  Substance and Sexual Activity   Alcohol use: Yes    Comment: rarely   Drug use: No    Frequency: 3.0 times per week   Sexual activity: Yes    Partners: Male    Birth control/protection: Condom    Comment: given condoms  Other Topics Concern   Not on file  Social History Narrative   Occupation:  Unemployed   Never Smoked   Alcohol use-yes   Regular exercise-no   Social Determinants of Health   Financial Resource  Strain: Not on file  Food Insecurity: Not on file  Transportation Needs: Not on file  Physical Activity: Not on file  Stress: Not on file  Social Connections: Not on file    Labs: Lab Results  Component Value Date   HIV1RNAQUANT Not Detected 06/07/2022   HIV1RNAQUANT Not Detected 02/14/2022   HIV1RNAQUANT <20 (H) 12/09/2021   CD4TABS 753 08/12/2021   CD4TABS 655 12/16/2020   CD4TABS 631 06/16/2020    RPR and STI Lab Results  Component Value Date   LABRPR NON-REACTIVE 08/12/2021   LABRPR NON-REACTIVE 12/16/2020   LABRPR NON-REACTIVE 06/16/2020   LABRPR NON-REACTIVE 05/07/2019   LABRPR NON-REACTIVE 08/14/2018    STI Results GC GC CT CT  Latest Ref Rng & Units  NEGATIVE  NEGATIVE  08/12/2021 10:14 AM Negative    Negative    Negative   Negative    Negative    Negative    02/15/2021  9:25 AM Negative   Negative    06/16/2020 10:36 AM Negative    Negative    Negative   Negative    Negative    Negative    05/07/2019  9:59 AM Negative    Negative    Negative   Negative    Negative    Negative    08/14/2018 12:00 AM Negative   Negative    10/10/2013  2:34 AM  POSITIVE  NEGATIVE     Hepatitis B Lab Results  Component Value Date   HEPBSAB REACTIVE (A) 12/16/2020   HEPBSAG NEGATIVE 11/11/2010   Hepatitis C Lab Results  Component Value Date   HEPCAB NON-REACTIVE 12/16/2020   Hepatitis A Lab Results  Component Value Date   HAV REACTIVE (A) 12/16/2020   Lipids: Lab Results  Component Value Date   CHOL 194 08/12/2021   TRIG 157 (H) 08/12/2021   HDL 39 (L) 08/12/2021   CHOLHDL 5.0 (H) 08/12/2021   VLDL 19 12/14/2012   LDLCALC 128 (H) 08/12/2021    TARGET DATE: the 26th  Assessment: Shishir presents today for his maintenance Cabenuva injections. Past injections were tolerated well without issues. He would like to continue with the monthly dosing schedule and is not interested in moving to every 2 months. He recently started a new job. Let him know if he  changes his mind about the monthly injection schedule to let us know.   Administered cabotegravir 600mg /4mL in left upper outer quadrant of the gluteal muscle. Administered rilpivirine 900 mg/20mL in the right upper outer quadrant of the gluteal muscle. No issues with injections. He will follow up in 1 month for next set of injections.  He is up-to-date on his vaccines. His last HIV RNA was undetectable in March. No new sexual partners since last visit. He politely declines STI testing today.   Plan: - Cabenuva injections administered - Next injections scheduled for 09/13/22 with [name] and 10/18/22 with Dr. Luciana Axe - Call with any issues or questions  Irish Elders, PharmD PGY-1 [facility] Pharmacy Resident

## 2022-08-08 ENCOUNTER — Ambulatory Visit (INDEPENDENT_AMBULATORY_CARE_PROVIDER_SITE_OTHER): Payer: Medicaid Other | Admitting: Pharmacist

## 2022-08-08 ENCOUNTER — Other Ambulatory Visit: Payer: Self-pay

## 2022-08-08 DIAGNOSIS — B20 Human immunodeficiency virus [HIV] disease: Secondary | ICD-10-CM | POA: Diagnosis present

## 2022-08-08 DIAGNOSIS — Z113 Encounter for screening for infections with a predominantly sexual mode of transmission: Secondary | ICD-10-CM

## 2022-08-08 MED ORDER — CABOTEGRAVIR & RILPIVIRINE ER 400 & 600 MG/2ML IM SUER
1.0000 | Freq: Once | INTRAMUSCULAR | Status: AC
  Administered 2022-08-08: 1 via INTRAMUSCULAR

## 2022-08-25 ENCOUNTER — Encounter: Payer: Self-pay | Admitting: Pharmacist

## 2022-08-26 ENCOUNTER — Other Ambulatory Visit (HOSPITAL_COMMUNITY): Payer: Self-pay

## 2022-09-01 ENCOUNTER — Telehealth: Payer: Self-pay

## 2022-09-01 NOTE — Telephone Encounter (Signed)
[name] Patient Advocate Encounter  [name] medication Jose Olsen) have been couriered to [name] from [employer] [employer] [facility] and will be administered on the patient next office visit on 09/13/22.  [name] , CPhT Specialty Pharmacy Patient [facility] for Infectious Disease Phone: [phone] Fax:  [phone]

## 2022-09-13 ENCOUNTER — Other Ambulatory Visit: Payer: Self-pay

## 2022-09-13 ENCOUNTER — Ambulatory Visit (INDEPENDENT_AMBULATORY_CARE_PROVIDER_SITE_OTHER): Payer: Medicaid Other | Admitting: Pharmacist

## 2022-09-13 DIAGNOSIS — B20 Human immunodeficiency virus [HIV] disease: Secondary | ICD-10-CM

## 2022-09-13 MED ORDER — CABOTEGRAVIR & RILPIVIRINE ER 400 & 600 MG/2ML IM SUER
1.0000 | Freq: Once | INTRAMUSCULAR | Status: AC
  Administered 2022-09-13: 1 via INTRAMUSCULAR

## 2022-09-13 NOTE — Progress Notes (Signed)
HPI: Jose Olsen is a 37 y.o. male who presents to the [name] pharmacy clinic for [place] administration.  Patient Active Problem List   Diagnosis Date Noted   Injection education, encounter for 10/13/2020   Healthcare maintenance 09/15/2020   Screening for STDs (sexually transmitted diseases) 06/16/2020   Medication monitoring encounter 11/04/2019   HTN (hypertension) 05/07/2019   Screening examination for venereal disease 03/28/2016   Encounter for long-term (current) use of high-risk medication 03/28/2016   Human immunodeficiency virus (HIV) disease ([facility]) 12/02/2010   ONYCHOMYCOSIS 05/19/2009    [name] Medications  New Prescriptions   No medications on file  Previous Medications   CABOTEGRAVIR & RILPIVIRINE ER (CABENUVA) 400 & 600 MG/2ML INJECTION    Inject 1 kit into the muscle every 30 (thirty) days.   FLUCONAZOLE (DIFLUCAN) 200 MG TABLET    Take 1 tablet (200 mg total) by mouth daily.  Modified Medications   No medications on file  Discontinued Medications   No medications on file    Allergies: No Known Allergies  Past Medical History: Past Medical History:  Diagnosis Date   Chicken pox    HIV infection ([facility])     Social History: Social History   Socioeconomic History   Marital status: Single    Spouse name: Not on file   Number of children: Not on file   Years of education: Not on file   Highest education level: Not on file  Occupational History   Not on file  Tobacco Use   Smoking status: Never   Smokeless tobacco: Never  Substance and Sexual Activity   Alcohol use: Yes    Comment: rarely   Drug use: No    Frequency: 3.0 times per week   Sexual activity: Yes    Partners: Male    Birth control/protection: Condom    Comment: given condoms  Other Topics Concern   Not on file  Social History Narrative   Occupation:  Unemployed   Never Smoked   Alcohol use-yes   Regular exercise-no   Social Determinants of Health   Financial Resource  Strain: Not on file  Food Insecurity: Not on file  Transportation Needs: Not on file  Physical Activity: Not on file  Stress: Not on file  Social Connections: Not on file    Labs: Lab Results  Component Value Date   HIV1RNAQUANT Not Detected 06/07/2022   HIV1RNAQUANT Not Detected 02/14/2022   HIV1RNAQUANT <20 (H) 12/09/2021   CD4TABS 753 08/12/2021   CD4TABS 655 12/16/2020   CD4TABS 631 06/16/2020    RPR and STI Lab Results  Component Value Date   LABRPR NON-REACTIVE 08/12/2021   LABRPR NON-REACTIVE 12/16/2020   LABRPR NON-REACTIVE 06/16/2020   LABRPR NON-REACTIVE 05/07/2019   LABRPR NON-REACTIVE 08/14/2018    STI Results GC GC CT CT  Latest Ref Rng & Units  NEGATIVE  NEGATIVE  08/12/2021 10:14 AM Negative    Negative    Negative   Negative    Negative    Negative    02/15/2021  9:25 AM Negative   Negative    06/16/2020 10:36 AM Negative    Negative    Negative   Negative    Negative    Negative    05/07/2019  9:59 AM Negative    Negative    Negative   Negative    Negative    Negative    08/14/2018 12:00 AM Negative   Negative    10/10/2013  2:34 AM  POSITIVE  NEGATIVE     Hepatitis B Lab Results  Component Value Date   HEPBSAB REACTIVE (A) 12/16/2020   HEPBSAG NEGATIVE 11/11/2010   Hepatitis C Lab Results  Component Value Date   HEPCAB NON-REACTIVE 12/16/2020   Hepatitis A Lab Results  Component Value Date   HAV REACTIVE (A) 12/16/2020   Lipids: Lab Results  Component Value Date   CHOL 194 08/12/2021   TRIG 157 (H) 08/12/2021   HDL 39 (Olsen) 08/12/2021   CHOLHDL 5.0 (H) 08/12/2021   VLDL 19 12/14/2012   LDLCALC 128 (H) 08/12/2021    TARGET DATE: The 26th  Assessment: Jose Olsen presents today for his maintenance Cabenuva injections. Past injections were tolerated well without issues.    Administered cabotegravir 400mg /28mL in left upper outer quadrant of the gluteal muscle. Administered rilpivirine 600 mg/17mL in the right upper outer  quadrant of the gluteal muscle. No issues with injections. He will follow up in 1 month for next set of injections.   He is requesting STI testing and HIV RNA be drawn at his visit next month with Dr. Luciana Axe.   Plan: - Cabenuva injections administered - Next injections scheduled for 10/18/22 with Dr. Luciana Axe; appts scheduled for remainder of year with me - Call with any issues or questions  Jose Olsen. Kymberli Wiegand, PharmD, BCIDP, AAHIVP, CPP Clinical Pharmacist Practitioner Infectious Diseases Clinical Pharmacist [facility]

## 2022-10-06 ENCOUNTER — Other Ambulatory Visit (HOSPITAL_COMMUNITY): Payer: Self-pay

## 2022-10-11 ENCOUNTER — Other Ambulatory Visit (HOSPITAL_COMMUNITY): Payer: Self-pay

## 2022-10-13 ENCOUNTER — Telehealth: Payer: Self-pay

## 2022-10-13 NOTE — Telephone Encounter (Signed)
/  [name] Patient Advocate Encounter  [name] medication (Cabenuva 400-600MG ) have been couriered to [name] from [facility] [employer] [facility] and will be administered on the patient next office visit on 10/18/22.  [name] , CPhT Specialty Pharmacy Patient [facility] for Infectious Disease Phone: [phone] Fax:  [phone]

## 2022-10-18 ENCOUNTER — Ambulatory Visit: Payer: Medicaid Other | Admitting: Internal Medicine

## 2022-10-19 ENCOUNTER — Other Ambulatory Visit (HOSPITAL_COMMUNITY)
Admission: RE | Admit: 2022-10-19 | Discharge: 2022-10-19 | Disposition: A | Discharge status: Home / Self Care | Payer: Medicaid Other | Source: Ambulatory Visit | Attending: Internal Medicine | Admitting: Internal Medicine

## 2022-10-19 ENCOUNTER — Encounter: Payer: Self-pay | Admitting: Internal Medicine

## 2022-10-19 ENCOUNTER — Other Ambulatory Visit: Payer: Self-pay

## 2022-10-19 ENCOUNTER — Ambulatory Visit: Payer: Medicaid Other | Admitting: Internal Medicine

## 2022-10-19 VITALS — BP 123/74 | HR 94 | Temp 98.0°F | Resp 16 | Wt 197.0 lb

## 2022-10-19 DIAGNOSIS — Z79899 Other long term (current) drug therapy: Secondary | ICD-10-CM | POA: Diagnosis not present

## 2022-10-19 DIAGNOSIS — B20 Human immunodeficiency virus [HIV] disease: Secondary | ICD-10-CM | POA: Diagnosis present

## 2022-10-19 DIAGNOSIS — Z5181 Encounter for therapeutic drug level monitoring: Secondary | ICD-10-CM | POA: Diagnosis not present

## 2022-10-19 DIAGNOSIS — Z113 Encounter for screening for infections with a predominantly sexual mode of transmission: Secondary | ICD-10-CM

## 2022-10-19 LAB — CBC WITH DIFFERENTIAL/PLATELET
Absolute Monocytes: 363 cells/uL (ref 200–950)
Basophils Absolute: 32 cells/uL (ref 0–200)
Basophils Relative: 0.7 %
Eosinophils Absolute: 92 cells/uL (ref 15–500)
Eosinophils Relative: 2 %
HCT: 40.9 % (ref 38.5–50.0)
Hemoglobin: 14 g/dL (ref 13.2–17.1)
Lymphs Abs: 2167 cells/uL (ref 850–3900)
MCH: 32.4 pg (ref 27.0–33.0)
MCHC: 34.2 g/dL (ref 32.0–36.0)
MCV: 94.7 fL (ref 80.0–100.0)
MPV: 10.2 fL (ref 7.5–12.5)
Monocytes Relative: 7.9 %
Neutro Abs: 1946 cells/uL (ref 1500–7800)
Neutrophils Relative %: 42.3 %
Platelets: 259 10*3/uL (ref 140–400)
RBC: 4.32 10*6/uL (ref 4.20–5.80)
RDW: 11.7 % (ref 11.0–15.0)
Total Lymphocyte: 47.1 %
WBC: 4.6 10*3/uL (ref 3.8–10.8)

## 2022-10-19 MED ORDER — CABOTEGRAVIR & RILPIVIRINE ER 400 & 600 MG/2ML IM SUER
1.0000 | Freq: Once | INTRAMUSCULAR | Status: AC
Start: 2022-10-19 — End: 2022-10-19
  Administered 2022-10-19: 1 via INTRAMUSCULAR

## 2022-10-19 NOTE — Assessment & Plan Note (Signed)
Will screen 

## 2022-10-19 NOTE — Assessment & Plan Note (Signed)
He continues to do well and will continue with monthly injections.  Will give today and has follow ups scheduled.   Previous labs reviewed with him and will do labs today

## 2022-10-19 NOTE — Assessment & Plan Note (Signed)
Will check his cmp, cbc

## 2022-10-19 NOTE — Progress Notes (Signed)
   Subjective:    Patient ID: Jose Olsen, male    DOB: February 24, 1986, 37 y.o.   MRN: [id number]  HPI Jose Olsen is here for follow up of HIV He continues on [place] and has been compliant with appointments.  He has remained not detected.  No new issues.  Pleased with the monthly injections.    Review of Systems  Constitutional:  Negative for fatigue.  Gastrointestinal:  Negative for nausea.  Skin:  Negative for rash.       Objective:   Physical Exam Eyes:     General: No scleral icterus. Pulmonary:     Effort: Pulmonary effort is normal.  Neurological:     Mental Status: He is alert.   SH: no tobacco        Assessment & Plan:

## 2022-10-19 NOTE — Assessment & Plan Note (Signed)
Lipid panel today

## 2022-10-28 ENCOUNTER — Other Ambulatory Visit (HOSPITAL_COMMUNITY): Payer: Self-pay

## 2022-11-01 ENCOUNTER — Other Ambulatory Visit (HOSPITAL_COMMUNITY): Payer: Self-pay

## 2022-11-04 ENCOUNTER — Other Ambulatory Visit (HOSPITAL_COMMUNITY): Payer: Self-pay

## 2022-11-07 ENCOUNTER — Telehealth: Payer: Self-pay

## 2022-11-07 NOTE — Telephone Encounter (Signed)
[name] Patient Advocate Encounter  [name] medication (Cabenuva 400-600mg ) have been couriered to [name] from [facility] and will be administered on the patient next office visit on 11/10/22.  [name] , CPhT Specialty Pharmacy Patient [facility] for Infectious Disease Phone: [phone] Fax:  [phone]

## 2022-11-08 NOTE — Progress Notes (Incomplete)
HPI: Mills Mckaig is a 37 y.o. male who presents to the [name] pharmacy clinic for [place] administration.  Patient Active Problem List   Diagnosis Date Noted   Injection education, encounter for 10/13/2020   Healthcare maintenance 09/15/2020   Screening for STDs (sexually transmitted diseases) 06/16/2020   Medication monitoring encounter 11/04/2019   HTN (hypertension) 05/07/2019   Screening examination for venereal disease 03/28/2016   Encounter for long-term (current) use of high-risk medication 03/28/2016   Human immunodeficiency virus (HIV) disease (HCC) 12/02/2010   ONYCHOMYCOSIS 05/19/2009    [name] Medications  New Prescriptions   No medications on file  Previous Medications   CABOTEGRAVIR & RILPIVIRINE ER (CABENUVA) 400 & 600 MG/2ML INJECTION    Inject 1 kit into the muscle every 30 (thirty) days.   FLUCONAZOLE (DIFLUCAN) 200 MG TABLET    Take 1 tablet (200 mg total) by mouth daily.  Modified Medications   No medications on file  Discontinued Medications   No medications on file    Allergies: No Known Allergies  Labs: Lab Results  Component Value Date   HIV1RNAQUANT Not Detected 10/19/2022   HIV1RNAQUANT Not Detected 06/07/2022   HIV1RNAQUANT Not Detected 02/14/2022   CD4TABS 815 10/19/2022   CD4TABS 753 08/12/2021   CD4TABS 655 12/16/2020    RPR and STI Lab Results  Component Value Date   LABRPR NON-REACTIVE 10/19/2022   LABRPR NON-REACTIVE 08/12/2021   LABRPR NON-REACTIVE 12/16/2020   LABRPR NON-REACTIVE 06/16/2020   LABRPR NON-REACTIVE 05/07/2019    STI Results GC GC CT CT  Latest Ref Rng & Units  NEGATIVE  NEGATIVE  10/19/2022  2:01 PM Negative    Negative    Negative   Negative    Negative    Negative    08/12/2021 10:14 AM Negative    Negative    Negative   Negative    Negative    Negative    02/15/2021  9:25 AM Negative   Negative    06/16/2020 10:36 AM Negative    Negative    Negative   Negative    Negative    Negative     05/07/2019  9:59 AM Negative    Negative    Negative   Negative    Negative    Negative    08/14/2018 12:00 AM Negative   Negative    10/10/2013  2:34 AM  POSITIVE   NEGATIVE     Hepatitis B Lab Results  Component Value Date   HEPBSAB REACTIVE (A) 12/16/2020   HEPBSAG NEGATIVE 11/11/2010   Hepatitis C Lab Results  Component Value Date   HEPCAB NON-REACTIVE 12/16/2020   Hepatitis A Lab Results  Component Value Date   HAV REACTIVE (A) 12/16/2020   Lipids: Lab Results  Component Value Date   CHOL 175 10/19/2022   TRIG 187 (H) 10/19/2022   HDL 34 (L) 10/19/2022   CHOLHDL 5.1 (H) 10/19/2022   VLDL 19 12/14/2012   LDLCALC 111 (H) 10/19/2022    TARGET DATE: The 26th of the month  Assessment: Chasity presents today for his maintenance Cabenuva injections. Past injections were tolerated well without issues.  Administered cabotegravir 600mg /35mL in left upper outer quadrant of the gluteal muscle. Administered rilpivirine 900 mg/35mL in the right upper outer quadrant of the gluteal muscle. No issues with injections. He will follow up in 1 month for next set of injections.  Plan: - Cabenuva injections administered - Next injections scheduled for 9/26, 10/22, 11/20, and 12/19 with [name]  -  Call with any issues or questions  Lennie Muckle, PharmD PGY1 Pharmacy Resident 11/08/2022 7:18 PM

## 2022-11-10 ENCOUNTER — Encounter: Payer: Self-pay | Admitting: Pharmacist

## 2022-11-10 ENCOUNTER — Ambulatory Visit: Payer: Medicaid Other | Admitting: Pharmacist

## 2022-11-14 NOTE — Progress Notes (Incomplete)
HPI: Jose Olsen is a 37 y.o. male who presents to the [name] pharmacy clinic for [place] administration.  Patient Active Problem List   Diagnosis Date Noted   Injection education, encounter for 10/13/2020   Healthcare maintenance 09/15/2020   Screening for STDs (sexually transmitted diseases) 06/16/2020   Medication monitoring encounter 11/04/2019   HTN (hypertension) 05/07/2019   Screening examination for venereal disease 03/28/2016   Encounter for long-term (current) use of high-risk medication 03/28/2016   Human immunodeficiency virus (HIV) disease (HCC) 12/02/2010   ONYCHOMYCOSIS 05/19/2009    [name] Medications  New Prescriptions   No medications on file  Previous Medications   CABOTEGRAVIR & RILPIVIRINE ER (CABENUVA) 400 & 600 MG/2ML INJECTION    Inject 1 kit into the muscle every 30 (thirty) days.   FLUCONAZOLE (DIFLUCAN) 200 MG TABLET    Take 1 tablet (200 mg total) by mouth daily.  Modified Medications   No medications on file  Discontinued Medications   No medications on file    Allergies: No Known Allergies  Labs: Lab Results  Component Value Date   HIV1RNAQUANT Not Detected 10/19/2022   HIV1RNAQUANT Not Detected 06/07/2022   HIV1RNAQUANT Not Detected 02/14/2022   CD4TABS 815 10/19/2022   CD4TABS 753 08/12/2021   CD4TABS 655 12/16/2020    RPR and STI Lab Results  Component Value Date   LABRPR NON-REACTIVE 10/19/2022   LABRPR NON-REACTIVE 08/12/2021   LABRPR NON-REACTIVE 12/16/2020   LABRPR NON-REACTIVE 06/16/2020   LABRPR NON-REACTIVE 05/07/2019    STI Results GC GC CT CT  Latest Ref Rng & Units  NEGATIVE  NEGATIVE  10/19/2022  2:01 PM Negative    Negative    Negative   Negative    Negative    Negative    08/12/2021 10:14 AM Negative    Negative    Negative   Negative    Negative    Negative    02/15/2021  9:25 AM Negative   Negative    06/16/2020 10:36 AM Negative    Negative    Negative   Negative    Negative    Negative     05/07/2019  9:59 AM Negative    Negative    Negative   Negative    Negative    Negative    08/14/2018 12:00 AM Negative   Negative    10/10/2013  2:34 AM  POSITIVE   NEGATIVE     Hepatitis B Lab Results  Component Value Date   HEPBSAB REACTIVE (A) 12/16/2020   HEPBSAG NEGATIVE 11/11/2010   Hepatitis C Lab Results  Component Value Date   HEPCAB NON-REACTIVE 12/16/2020   Hepatitis A Lab Results  Component Value Date   HAV REACTIVE (A) 12/16/2020   Lipids: Lab Results  Component Value Date   CHOL 175 10/19/2022   TRIG 187 (H) 10/19/2022   HDL 34 (L) 10/19/2022   CHOLHDL 5.1 (H) 10/19/2022   VLDL 19 12/14/2012   LDLCALC 111 (H) 10/19/2022    TARGET DATE: The 26th of the month  Assessment: Jose Olsen presents today for his maintenance Cabenuva injections. Past injections were tolerated well without issues.  Administered cabotegravir 600mg /22mL in left upper outer quadrant of the gluteal muscle. Administered rilpivirine 900 mg/68mL in the right upper outer quadrant of the gluteal muscle. No issues with injections. He will follow up in 1 month for next set of injections.  Plan: - Cabenuva injections administered - Next injections scheduled for *** - Call with any issues or questions  Lennie Muckle, PharmD PGY1 Pharmacy Resident 11/14/2022 12:13 PM

## 2022-11-15 ENCOUNTER — Other Ambulatory Visit: Payer: Self-pay

## 2022-11-15 ENCOUNTER — Ambulatory Visit (INDEPENDENT_AMBULATORY_CARE_PROVIDER_SITE_OTHER): Payer: Medicaid Other | Admitting: Pharmacist

## 2022-11-15 DIAGNOSIS — B20 Human immunodeficiency virus [HIV] disease: Secondary | ICD-10-CM | POA: Diagnosis present

## 2022-11-15 MED ORDER — CABOTEGRAVIR & RILPIVIRINE ER 400 & 600 MG/2ML IM SUER
1.0000 | Freq: Once | INTRAMUSCULAR | Status: AC
Start: 2022-11-15 — End: 2022-11-15
  Administered 2022-11-15: 1 via INTRAMUSCULAR

## 2022-12-05 ENCOUNTER — Other Ambulatory Visit (HOSPITAL_COMMUNITY): Payer: Self-pay

## 2022-12-07 ENCOUNTER — Other Ambulatory Visit: Payer: Self-pay

## 2022-12-12 ENCOUNTER — Telehealth: Payer: Self-pay

## 2022-12-12 NOTE — Telephone Encounter (Signed)
Medicaid Managed Care   Unsuccessful Outreach Note  12/12/2022 Name: Jose Olsen MRN: [id number] DOB: Jan 08, 1986  Referred by: Patient, No Pcp Per Reason for referral : No chief complaint on file.   An unsuccessful telephone outreach was attempted today. The patient was referred to the case management team for assistance with care management and care coordination.   Follow Up Plan: If patient returns call to provider office, please advise to call Embedded Care Management Care Guide Nicholes Rough* at [phone]*  Nicholes Rough, CMA Care Guide [employer]

## 2022-12-15 ENCOUNTER — Ambulatory Visit: Payer: Medicaid Other | Admitting: Pharmacist

## 2022-12-15 NOTE — Progress Notes (Signed)
HPI: Jose Olsen is a 37 y.o. male who presents to the [name] pharmacy clinic for [place] administration.  Patient Active Problem List   Diagnosis Date Noted   Injection education, encounter for 10/13/2020   Healthcare maintenance 09/15/2020   Screening for STDs (sexually transmitted diseases) 06/16/2020   Medication monitoring encounter 11/04/2019   HTN (hypertension) 05/07/2019   Screening examination for venereal disease 03/28/2016   Encounter for long-term (current) use of high-risk medication 03/28/2016   Human immunodeficiency virus (HIV) disease (HCC) 12/02/2010   ONYCHOMYCOSIS 05/19/2009    [name] Medications  New Prescriptions   No medications on file  Previous Medications   CABOTEGRAVIR & RILPIVIRINE ER (CABENUVA) 400 & 600 MG/2ML INJECTION    Inject 1 kit into the muscle every 30 (thirty) days.   FLUCONAZOLE (DIFLUCAN) 200 MG TABLET    Take 1 tablet (200 mg total) by mouth daily.  Modified Medications   No medications on file  Discontinued Medications   No medications on file    Allergies: No Known Allergies  Labs: Lab Results  Component Value Date   HIV1RNAQUANT Not Detected 10/19/2022   HIV1RNAQUANT Not Detected 06/07/2022   HIV1RNAQUANT Not Detected 02/14/2022   CD4TABS 815 10/19/2022   CD4TABS 753 08/12/2021   CD4TABS 655 12/16/2020    RPR and STI Lab Results  Component Value Date   LABRPR NON-REACTIVE 10/19/2022   LABRPR NON-REACTIVE 08/12/2021   LABRPR NON-REACTIVE 12/16/2020   LABRPR NON-REACTIVE 06/16/2020   LABRPR NON-REACTIVE 05/07/2019    STI Results GC GC CT CT  Latest Ref Rng & Units  NEGATIVE  NEGATIVE  10/19/2022  2:01 PM Negative    Negative    Negative   Negative    Negative    Negative    08/12/2021 10:14 AM Negative    Negative    Negative   Negative    Negative    Negative    02/15/2021  9:25 AM Negative   Negative    06/16/2020 10:36 AM Negative    Negative    Negative   Negative    Negative    Negative     05/07/2019  9:59 AM Negative    Negative    Negative   Negative    Negative    Negative    08/14/2018 12:00 AM Negative   Negative    10/10/2013  2:34 AM  POSITIVE   NEGATIVE     Hepatitis B Lab Results  Component Value Date   HEPBSAB REACTIVE (A) 12/16/2020   HEPBSAG NEGATIVE 11/11/2010   Hepatitis C Lab Results  Component Value Date   HEPCAB NON-REACTIVE 12/16/2020   Hepatitis A Lab Results  Component Value Date   HAV REACTIVE (A) 12/16/2020   Lipids: Lab Results  Component Value Date   CHOL 175 10/19/2022   TRIG 187 (H) 10/19/2022   HDL 34 (L) 10/19/2022   CHOLHDL 5.1 (H) 10/19/2022   VLDL 19 12/14/2012   LDLCALC 111 (H) 10/19/2022    TARGET DATE: The 26th  Assessment: Jose Olsen presents today for his maintenance Cabenuva injections. Past injections were tolerated well without issues. Last HIV RNA was undetectable in July. Will defer labs today. Politely declines STI testing. Agrees to receive the annual flu and 2024-2025 COVID vaccines today.  Administered cabotegravir 400mg /67mL in left upper outer quadrant of the gluteal muscle. Administered rilpivirine 600 mg/26mL in the right upper outer quadrant of the gluteal muscle. No issues with injections. He will follow up in 1 month for  next set of injections.   Plan: - Cabenuva injections administered - Administered the annual flu vaccine today - Administered the 2024-2025 COVID vaccine today - Next injections scheduled for 01/10/23, 02/08/23, and 03/09/23 with me - Call with any issues or questions  Antony Sian L. Malvern Kadlec, PharmD, BCIDP, AAHIVP, CPP Clinical Pharmacist Practitioner Infectious Diseases Clinical Pharmacist [facility]

## 2022-12-19 ENCOUNTER — Ambulatory Visit: Payer: Medicaid Other | Admitting: Pharmacist

## 2022-12-19 ENCOUNTER — Other Ambulatory Visit: Payer: Self-pay

## 2022-12-19 DIAGNOSIS — Z23 Encounter for immunization: Secondary | ICD-10-CM

## 2022-12-19 DIAGNOSIS — B20 Human immunodeficiency virus [HIV] disease: Secondary | ICD-10-CM

## 2022-12-19 MED ORDER — CABOTEGRAVIR & RILPIVIRINE ER 600 & 900 MG/3ML IM SUER
1.0000 | Freq: Once | INTRAMUSCULAR | Status: DC
Start: 2022-12-19 — End: 2022-12-19

## 2022-12-19 MED ORDER — CABOTEGRAVIR & RILPIVIRINE ER 400 & 600 MG/2ML IM SUER
1.0000 | Freq: Once | INTRAMUSCULAR | Status: AC
Start: 2022-12-19 — End: 2022-12-19
  Administered 2022-12-19: 1 via INTRAMUSCULAR

## 2022-12-27 ENCOUNTER — Other Ambulatory Visit (HOSPITAL_COMMUNITY): Payer: Self-pay

## 2022-12-27 ENCOUNTER — Other Ambulatory Visit: Payer: Self-pay

## 2022-12-27 NOTE — Progress Notes (Signed)
Specialty Pharmacy Refill Coordination Note  Jose Olsen is a 37 y.o. male assessed today regarding refills of clinic administered specialty medication(s) Cabotegravir & Rilpivirine   Clinic requested Courier to Provider Office   Delivery date: 01/11/23  Verified address: [name] [address] [place] [address]   Medication will be filled on 01/10/23.        :

## 2023-01-10 ENCOUNTER — Ambulatory Visit: Payer: Self-pay | Admitting: Pharmacist

## 2023-01-13 ENCOUNTER — Telehealth: Payer: Self-pay

## 2023-01-13 NOTE — Telephone Encounter (Signed)
[name] Patient Advocate Encounter  [name] medications CABENUVA have been couriered to [name] from [facility] and will be administered at the patients appointment on 01/16/23.  Kae Heller, CPhT Specialty Pharmacy Patient [facility] for Infectious Disease Phone: [phone] Fax:  [phone]

## 2023-01-16 ENCOUNTER — Ambulatory Visit: Payer: Medicaid Other | Admitting: Pharmacist

## 2023-01-19 ENCOUNTER — Ambulatory Visit (INDEPENDENT_AMBULATORY_CARE_PROVIDER_SITE_OTHER): Payer: Medicaid Other | Admitting: Pharmacist

## 2023-01-19 ENCOUNTER — Other Ambulatory Visit: Payer: Self-pay

## 2023-01-19 DIAGNOSIS — B20 Human immunodeficiency virus [HIV] disease: Secondary | ICD-10-CM

## 2023-01-19 MED ORDER — CABOTEGRAVIR & RILPIVIRINE ER 400 & 600 MG/2ML IM SUER
1.0000 | Freq: Once | INTRAMUSCULAR | Status: AC
Start: 2023-01-19 — End: 2023-01-19
  Administered 2023-01-19: 1 via INTRAMUSCULAR

## 2023-01-19 NOTE — Progress Notes (Signed)
HPI: Jose Olsen is a 37 y.o. male who presents to the [name] pharmacy clinic for [place] administration.  Patient Active Problem List   Diagnosis Date Noted   Injection education, encounter for 10/13/2020   Healthcare maintenance 09/15/2020   Screening for STDs (sexually transmitted diseases) 06/16/2020   Medication monitoring encounter 11/04/2019   HTN (hypertension) 05/07/2019   Screening examination for venereal disease 03/28/2016   Encounter for long-term (current) use of high-risk medication 03/28/2016   Human immunodeficiency virus (HIV) disease (HCC) 12/02/2010   ONYCHOMYCOSIS 05/19/2009    [name] Medications  New Prescriptions   No medications on file  Previous Medications   CABOTEGRAVIR & RILPIVIRINE ER (CABENUVA) 400 & 600 MG/2ML INJECTION    Inject 1 kit into the muscle every 30 (thirty) days.   FLUCONAZOLE (DIFLUCAN) 200 MG TABLET    Take 1 tablet (200 mg total) by mouth daily.  Modified Medications   No medications on file  Discontinued Medications   No medications on file    Allergies: No Known Allergies  Labs: Lab Results  Component Value Date   HIV1RNAQUANT Not Detected 10/19/2022   HIV1RNAQUANT Not Detected 06/07/2022   HIV1RNAQUANT Not Detected 02/14/2022   CD4TABS 815 10/19/2022   CD4TABS 753 08/12/2021   CD4TABS 655 12/16/2020    RPR and STI Lab Results  Component Value Date   LABRPR NON-REACTIVE 10/19/2022   LABRPR NON-REACTIVE 08/12/2021   LABRPR NON-REACTIVE 12/16/2020   LABRPR NON-REACTIVE 06/16/2020   LABRPR NON-REACTIVE 05/07/2019    STI Results GC GC CT CT  Latest Ref Rng & Units  NEGATIVE  NEGATIVE  10/19/2022  2:01 PM Negative    Negative    Negative   Negative    Negative    Negative    08/12/2021 10:14 AM Negative    Negative    Negative   Negative    Negative    Negative    02/15/2021  9:25 AM Negative   Negative    06/16/2020 10:36 AM Negative    Negative    Negative   Negative    Negative    Negative     05/07/2019  9:59 AM Negative    Negative    Negative   Negative    Negative    Negative    08/14/2018 12:00 AM Negative   Negative    10/10/2013  2:34 AM  POSITIVE   NEGATIVE     Hepatitis B Lab Results  Component Value Date   HEPBSAB REACTIVE (A) 12/16/2020   HEPBSAG NEGATIVE 11/11/2010   Hepatitis C Lab Results  Component Value Date   HEPCAB NON-REACTIVE 12/16/2020   Hepatitis A Lab Results  Component Value Date   HAV REACTIVE (A) 12/16/2020   Lipids: Lab Results  Component Value Date   CHOL 175 10/19/2022   TRIG 187 (H) 10/19/2022   HDL 34 (L) 10/19/2022   CHOLHDL 5.1 (H) 10/19/2022   VLDL 19 12/14/2012   LDLCALC 111 (H) 10/19/2022    TARGET DATE: The 26th  Assessment: Jose Olsen presents today for his maintenance Cabenuva injections. Past injections were tolerated well without issues. Last HIV RNA was undetectable in July. Will check in January at Dr. Ephriam Knuckles appointment. Politely declines STI testing today. Already received flu and covid vaccine at appointment last month.  Administered cabotegravir 400mg /41mL in left upper outer quadrant of the gluteal muscle. Administered rilpivirine 600 mg/3mL in the right upper outer quadrant of the gluteal muscle. No issues with injections. He will follow up  in 1 month for next set of injections.   Plan: - Cabenuva injections administered - Next injections scheduled for 02/08/23 with me; 03/09/23 with Marchelle Folks; and 04/18/23 with Dr. Luciana Axe  - Call with any issues or questions  Deneane Stifter L. Deiondra Denley, PharmD, BCIDP, AAHIVP, CPP Clinical Pharmacist Practitioner Infectious Diseases Clinical Pharmacist [facility]

## 2023-01-25 ENCOUNTER — Other Ambulatory Visit: Payer: Self-pay

## 2023-02-03 ENCOUNTER — Other Ambulatory Visit: Payer: Self-pay

## 2023-02-03 ENCOUNTER — Other Ambulatory Visit (HOSPITAL_COMMUNITY): Payer: Self-pay

## 2023-02-03 NOTE — Progress Notes (Signed)
Specialty Pharmacy Refill Coordination Note  Jose Olsen is a 37 y.o. male assessed today regarding refills of clinic administered specialty medication(s) Cabotegravir & Rilpivirine   Clinic requested Courier to Provider Office   Delivery date: 02/07/23   Verified address: [name] [address] [place] [address]   Medication will be filled on 02/06/23.

## 2023-02-07 ENCOUNTER — Other Ambulatory Visit: Payer: Self-pay

## 2023-02-07 ENCOUNTER — Telehealth: Payer: Self-pay

## 2023-02-07 NOTE — Telephone Encounter (Signed)
[name] Patient Advocate Encounter  [name] medications CABENUVA have been couriered to [name] from [facility] and will be administered at the patients appointment on 03/09/23.  Kae Heller, CPhT Specialty Pharmacy Patient [facility] for Infectious Disease Phone: [phone] Fax:  [phone]

## 2023-02-08 ENCOUNTER — Ambulatory Visit: Payer: Medicaid Other | Admitting: Pharmacist

## 2023-02-13 NOTE — Progress Notes (Unsigned)
HPI: Jose Olsen is a 37 y.o. male who presents to the [name] pharmacy clinic for Jose Olsen administration.  Patient Active Problem List   Diagnosis Date Noted   Injection education, encounter for 10/13/2020   Healthcare maintenance 09/15/2020   Screening for STDs (sexually transmitted diseases) 06/16/2020   Medication monitoring encounter 11/04/2019   HTN (hypertension) 05/07/2019   Screening examination for venereal disease 03/28/2016   Encounter for long-term (current) use of high-risk medication 03/28/2016   Human immunodeficiency virus (HIV) disease (HCC) 12/02/2010   ONYCHOMYCOSIS 05/19/2009    [name] Medications  New Prescriptions   No medications on file  Previous Medications   CABOTEGRAVIR & RILPIVIRINE ER (CABENUVA) 400 & 600 MG/2ML INJECTION    Inject 1 kit into the muscle every 30 (thirty) days.   FLUCONAZOLE (DIFLUCAN) 200 MG TABLET    Take 1 tablet (200 mg total) by mouth daily.  Modified Medications   No medications on file  Discontinued Medications   No medications on file    Allergies: No Known Allergies  Labs: Lab Results  Component Value Date   HIV1RNAQUANT Not Detected 10/19/2022   HIV1RNAQUANT Not Detected 06/07/2022   HIV1RNAQUANT Not Detected 02/14/2022   CD4TABS 815 10/19/2022   CD4TABS 753 08/12/2021   CD4TABS 655 12/16/2020    RPR and STI Lab Results  Component Value Date   LABRPR NON-REACTIVE 10/19/2022   LABRPR NON-REACTIVE 08/12/2021   LABRPR NON-REACTIVE 12/16/2020   LABRPR NON-REACTIVE 06/16/2020   LABRPR NON-REACTIVE 05/07/2019    STI Results GC GC CT CT  Latest Ref Rng & Units  NEGATIVE  NEGATIVE  10/19/2022  2:01 PM Negative    Negative    Negative   Negative    Negative    Negative    08/12/2021 10:14 AM Negative    Negative    Negative   Negative    Negative    Negative    02/15/2021  9:25 AM Negative   Negative    06/16/2020 10:36 AM Negative    Negative    Negative   Negative    Negative    Negative     05/07/2019  9:59 AM Negative    Negative    Negative   Negative    Negative    Negative    08/14/2018 12:00 AM Negative   Negative    10/10/2013  2:34 AM  POSITIVE   NEGATIVE     Hepatitis B Lab Results  Component Value Date   HEPBSAB REACTIVE (A) 12/16/2020   HEPBSAG NEGATIVE 11/11/2010   Hepatitis C Lab Results  Component Value Date   HEPCAB NON-REACTIVE 12/16/2020   Hepatitis A Lab Results  Component Value Date   HAV REACTIVE (A) 12/16/2020   Lipids: Lab Results  Component Value Date   CHOL 175 10/19/2022   TRIG 187 (H) 10/19/2022   HDL 34 (L) 10/19/2022   CHOLHDL 5.1 (H) 10/19/2022   VLDL 19 12/14/2012   LDLCALC 111 (H) 10/19/2022    TARGET DATE: The 26th of the month  Assessment: Jose Olsen presents today for his maintenance Cabenuva injections. Past injections were tolerated well without issues. Last HIV RNA was undetectable in July, and will be due for labs in January with Dr. Luciana Olsen. Jose Olsen declines STI testing today. Discussed transition to every 2 month dosing to [name], as he has previously had concerns with adverse effects. He reports that he had a hard time tolerating the higher dose injections, and is not interested in transitioning to this  right now.  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 This was previously treated with oral terbinafine by Dr. Luciana Olsen with good resolution. I did not examine the area, but offer to take a picture for Dr. Luciana Olsen to review. Jose Olsen declined this, preferring to treat empirically. Will run this by Dr. Luciana Olsen.  Administered cabotegravir 400mg /40mL in left upper outer quadrant of the gluteal muscle. Administered rilpivirine 600 mg/62mL in the right upper outer quadrant of the gluteal muscle. No issues with injections. Jose Olsen will follow up in 1 month for next set of injections.  Immunizations: Jose Olsen is up to date on immunizations today.  Plan: - Cabenuva injections administered - Next  injections scheduled for 12/19 with Jose Olsen, and 04/18/23 with Dr. Luciana Olsen. - Follow up with Dr. Luciana Olsen regarding terbinafine - Call with any issues or questions  Lora Paula, PharmD PGY-2 Infectious Diseases Pharmacy Resident [facility] for Infectious Disease

## 2023-02-14 ENCOUNTER — Ambulatory Visit: Payer: Medicaid Other | Admitting: Pharmacist

## 2023-02-14 ENCOUNTER — Other Ambulatory Visit: Payer: Self-pay

## 2023-02-14 DIAGNOSIS — B20 Human immunodeficiency virus [HIV] disease: Secondary | ICD-10-CM

## 2023-02-14 MED ORDER — CABOTEGRAVIR & RILPIVIRINE ER 400 & 600 MG/2ML IM SUER
1.0000 | Freq: Once | INTRAMUSCULAR | Status: AC
  Administered 2023-02-14: 1 via INTRAMUSCULAR

## 2023-02-15 ENCOUNTER — Other Ambulatory Visit: Payer: Self-pay

## 2023-02-15 MED ORDER — TERBINAFINE HCL 250 MG PO TABS: 250.0000 mg | ORAL_TABLET | Freq: Every day | ORAL | 0 refills | Status: DC

## 2023-02-17 ENCOUNTER — Other Ambulatory Visit: Payer: Self-pay

## 2023-02-17 ENCOUNTER — Other Ambulatory Visit (HOSPITAL_COMMUNITY): Payer: Self-pay

## 2023-02-17 NOTE — Progress Notes (Signed)
Specialty Pharmacy Refill Coordination Note  Jose Olsen is a 37 y.o. male assessed today regarding refills of clinic administered specialty medication(s) Cabotegravir & Rilpivirine   Clinic requested Courier to Provider Office   Delivery date: 03/06/23   Verified address: [address] [place] [address]   Medication will be filled on 03/03/23.

## 2023-03-06 ENCOUNTER — Telehealth: Payer: Self-pay

## 2023-03-06 NOTE — Progress Notes (Deleted)
HPI: Jose Olsen is a 37 y.o. male who presents to the [name] pharmacy clinic for [place] administration.  Patient Active Problem List   Diagnosis Date Noted   Injection education, encounter for 10/13/2020   Healthcare maintenance 09/15/2020   Screening for STDs (sexually transmitted diseases) 06/16/2020   Medication monitoring encounter 11/04/2019   HTN (hypertension) 05/07/2019   Screening examination for venereal disease 03/28/2016   Encounter for long-term (current) use of high-risk medication 03/28/2016   Human immunodeficiency virus (HIV) disease (HCC) 12/02/2010   ONYCHOMYCOSIS 05/19/2009    [name] Medications  New Prescriptions   No medications on file  Previous Medications   CABOTEGRAVIR & RILPIVIRINE ER (CABENUVA) 400 & 600 MG/2ML INJECTION    Inject 1 kit into the muscle every 30 (thirty) days.   FLUCONAZOLE (DIFLUCAN) 200 MG TABLET    Take 1 tablet (200 mg total) by mouth daily.   TERBINAFINE (LAMISIL) 250 MG TABLET    Take 1 tablet (250 mg total) by mouth daily.  Modified Medications   No medications on file  Discontinued Medications   No medications on file    Allergies: No Known Allergies  Past Medical History: Past Medical History:  Diagnosis Date   Chicken pox    HIV infection (HCC)     Social History: Social History   Socioeconomic History   Marital status: Single    Spouse name: Not on file   Number of children: Not on file   Years of education: Not on file   Highest education level: Not on file  Occupational History   Not on file  Tobacco Use   Smoking status: Never   Smokeless tobacco: Never  Substance and Sexual Activity   Alcohol use: Yes    Comment: rarely   Drug use: No    Frequency: 3.0 times per week   Sexual activity: Yes    Partners: Male    Birth control/protection: Condom    Comment: declined condoms  Other Topics Concern   Not on file  Social History Narrative   Occupation:  Unemployed   Never Smoked   Alcohol  use-yes   Regular exercise-no   Social Drivers of Corporate investment banker Strain: Not on file  Food Insecurity: Not on file  Transportation Needs: Not on file  Physical Activity: Not on file  Stress: Not on file  Social Connections: Not on file    Labs: Lab Results  Component Value Date   HIV1RNAQUANT Not Detected 10/19/2022   HIV1RNAQUANT Not Detected 06/07/2022   HIV1RNAQUANT Not Detected 02/14/2022   CD4TABS 815 10/19/2022   CD4TABS 753 08/12/2021   CD4TABS 655 12/16/2020    RPR and STI Lab Results  Component Value Date   LABRPR NON-REACTIVE 10/19/2022   LABRPR NON-REACTIVE 08/12/2021   LABRPR NON-REACTIVE 12/16/2020   LABRPR NON-REACTIVE 06/16/2020   LABRPR NON-REACTIVE 05/07/2019    STI Results GC GC CT CT  Latest Ref Rng & Units  NEGATIVE  NEGATIVE  10/19/2022  2:01 PM Negative    Negative    Negative   Negative    Negative    Negative    08/12/2021 10:14 AM Negative    Negative    Negative   Negative    Negative    Negative    02/15/2021  9:25 AM Negative   Negative    06/16/2020 10:36 AM Negative    Negative    Negative   Negative    Negative    Negative  05/07/2019  9:59 AM Negative    Negative    Negative   Negative    Negative    Negative    08/14/2018 12:00 AM Negative   Negative    10/10/2013  2:34 AM  POSITIVE   NEGATIVE     Hepatitis B Lab Results  Component Value Date   HEPBSAB REACTIVE (A) 12/16/2020   HEPBSAG NEGATIVE 11/11/2010   Hepatitis C Lab Results  Component Value Date   HEPCAB NON-REACTIVE 12/16/2020   Hepatitis A Lab Results  Component Value Date   HAV REACTIVE (A) 12/16/2020   Lipids: Lab Results  Component Value Date   CHOL 175 10/19/2022   TRIG 187 (H) 10/19/2022   HDL 34 (L) 10/19/2022   CHOLHDL 5.1 (H) 10/19/2022   VLDL 19 12/14/2012   LDLCALC 111 (H) 10/19/2022    TARGET DATE:  The 26th of the month  Assessment: Jose Olsen presents today for their maintenance Cabenuva injections. Initial/past  injections were tolerated well without issues. No problems with systemic effects of injections.   Administered cabotegravir 600mg /30mL in left upper outer quadrant of the gluteal muscle. Administered rilpivirine 900 mg/9mL in the right upper outer quadrant of the gluteal muscle. Monitored patient for 10 minutes after injection. Injections were tolerated well without issue. Patient will follow up in 2 months for next injection. Will defer HIV RNA to his next visit.   Plan: - Cabenuva injections administered - Next injections scheduled for 1/28 with Dr. Luciana Axe and *** with *** - Call with any issues or questions  Margarite Gouge, PharmD, CPP, BCIDP, AAHIVP Clinical Pharmacist Practitioner Infectious Diseases Clinical Pharmacist [facility]

## 2023-03-06 NOTE — Telephone Encounter (Signed)
 [name] Patient Advocate Encounter  [name] medications CABENUVA have been couriered to [name] from [facility] and will be administered at the patients appointment on 03/09/23.  Kae Heller, CPhT Specialty Pharmacy Patient [facility] Phone: [phone] Fax:  [phone]

## 2023-03-08 ENCOUNTER — Ambulatory Visit: Payer: Medicaid Other | Admitting: Pharmacist

## 2023-03-09 ENCOUNTER — Ambulatory Visit: Payer: Medicaid Other | Admitting: Pharmacist

## 2023-03-09 ENCOUNTER — Encounter: Payer: Self-pay | Admitting: Pharmacist

## 2023-03-09 ENCOUNTER — Other Ambulatory Visit: Payer: Self-pay

## 2023-03-09 ENCOUNTER — Ambulatory Visit: Payer: Self-pay | Admitting: Pharmacist

## 2023-03-09 DIAGNOSIS — B20 Human immunodeficiency virus [HIV] disease: Secondary | ICD-10-CM

## 2023-03-09 DIAGNOSIS — B351 Tinea unguium: Secondary | ICD-10-CM

## 2023-03-09 MED ORDER — CABOTEGRAVIR & RILPIVIRINE ER 400 & 600 MG/2ML IM SUER
1.0000 | Freq: Once | INTRAMUSCULAR | Status: AC
  Administered 2023-03-09: 1 via INTRAMUSCULAR

## 2023-03-09 MED ORDER — TERBINAFINE HCL 250 MG PO TABS: 250.0000 mg | ORAL_TABLET | Freq: Every day | ORAL | 2 refills | Status: DC

## 2023-03-09 NOTE — Progress Notes (Signed)
HPI: Jose Olsen is a 37 y.o. male who presents to the [name] pharmacy clinic for [place] administration.  Patient Active Problem List   Diagnosis Date Noted   Injection education, encounter for 10/13/2020   Healthcare maintenance 09/15/2020   Screening for STDs (sexually transmitted diseases) 06/16/2020   Medication monitoring encounter 11/04/2019   HTN (hypertension) 05/07/2019   Screening examination for venereal disease 03/28/2016   Encounter for long-term (current) use of high-risk medication 03/28/2016   Human immunodeficiency virus (HIV) disease (HCC) 12/02/2010   ONYCHOMYCOSIS 05/19/2009    [name] Medications  New Prescriptions   No medications on file  Previous Medications   CABOTEGRAVIR & RILPIVIRINE ER (CABENUVA) 400 & 600 MG/2ML INJECTION    Inject 1 kit into the muscle every 30 (thirty) days.   FLUCONAZOLE (DIFLUCAN) 200 MG TABLET    Take 1 tablet (200 mg total) by mouth daily.  Modified Medications   Modified Medication Previous Medication   TERBINAFINE (LAMISIL) 250 MG TABLET terbinafine (LAMISIL) 250 MG tablet      Take 1 tablet (250 mg total) by mouth daily.    Take 1 tablet (250 mg total) by mouth daily.  Discontinued Medications   No medications on file    Allergies: No Known Allergies  Past Medical History: Past Medical History:  Diagnosis Date   Chicken pox    HIV infection (HCC)     Social History: Social History   Socioeconomic History   Marital status: Single    Spouse name: Not on file   Number of children: Not on file   Years of education: Not on file   Highest education level: Not on file  Occupational History   Not on file  Tobacco Use   Smoking status: Never   Smokeless tobacco: Never  Substance and Sexual Activity   Alcohol use: Yes    Comment: rarely   Drug use: No    Frequency: 3.0 times per week   Sexual activity: Yes    Partners: Male    Birth control/protection: Condom    Comment: declined condoms  Other Topics  Concern   Not on file  Social History Narrative   Occupation:  Unemployed   Never Smoked   Alcohol use-yes   Regular exercise-no   Social Drivers of Corporate investment banker Strain: Not on file  Food Insecurity: Not on file  Transportation Needs: Not on file  Physical Activity: Not on file  Stress: Not on file  Social Connections: Not on file    Labs: Lab Results  Component Value Date   HIV1RNAQUANT Not Detected 10/19/2022   HIV1RNAQUANT Not Detected 06/07/2022   HIV1RNAQUANT Not Detected 02/14/2022   CD4TABS 815 10/19/2022   CD4TABS 753 08/12/2021   CD4TABS 655 12/16/2020    RPR and STI Lab Results  Component Value Date   LABRPR NON-REACTIVE 10/19/2022   LABRPR NON-REACTIVE 08/12/2021   LABRPR NON-REACTIVE 12/16/2020   LABRPR NON-REACTIVE 06/16/2020   LABRPR NON-REACTIVE 05/07/2019    STI Results GC GC CT CT  Latest Ref Rng & Units  NEGATIVE  NEGATIVE  10/19/2022  2:01 PM Negative    Negative    Negative   Negative    Negative    Negative    08/12/2021 10:14 AM Negative    Negative    Negative   Negative    Negative    Negative    02/15/2021  9:25 AM Negative   Negative    06/16/2020 10:36 AM Negative  Negative    Negative   Negative    Negative    Negative    05/07/2019  9:59 AM Negative    Negative    Negative   Negative    Negative    Negative    08/14/2018 12:00 AM Negative   Negative    10/10/2013  2:34 AM  POSITIVE   NEGATIVE     Hepatitis B Lab Results  Component Value Date   HEPBSAB REACTIVE (A) 12/16/2020   HEPBSAG NEGATIVE 11/11/2010   Hepatitis C Lab Results  Component Value Date   HEPCAB NON-REACTIVE 12/16/2020   Hepatitis A Lab Results  Component Value Date   HAV REACTIVE (A) 12/16/2020   Lipids: Lab Results  Component Value Date   CHOL 175 10/19/2022   TRIG 187 (H) 10/19/2022   HDL 34 (L) 10/19/2022   CHOLHDL 5.1 (H) 10/19/2022   VLDL 19 12/14/2012   LDLCALC 111 (H) 10/19/2022    TARGET DATE:  The 26th  of the month  Assessment: Jose Olsen presents today for their maintenance Cabenuva injections. Initial/past injections were tolerated well without issues. No problems with systemic effects of injections.   Administered cabotegravir 400mg /103mL in left upper outer quadrant of the gluteal muscle. Administered rilpivirine 600 mg/88mL in the right upper outer quadrant of the gluteal muscle. Monitored patient for 10 minutes after injection. Injections were tolerated well without issue. Patient will follow up in 1 month for next injection. Will defer HIV RNA to his next visit.   Patient states last time he received terbinafine for onychomycosis he was treated for 3 months but only received 2 weeks this time. Per previous documentation when Dr. Luciana Axe treated him last time, I will send in additional refills for him. This will last for 10 weeks total, and he can request more medication with Dr. Luciana Axe next time if needed. As we will check his HIV RNA at his next visit, Dr. Luciana Axe can also check CMP to ensure liver enzymes have not been impacted with terbinafine.   Up-to-date on vaccines at this time. Declines STI testing as he remains monogamous with the same partner for several years.   Plan: - Cabenuva injections administered - Refill terbinafine x 8 weeks  - Next injections scheduled for 1/28 with Dr. Luciana Axe and 2/26 with me - Call with any issues or questions  Margarite Gouge, PharmD, CPP, BCIDP, AAHIVP Clinical Pharmacist Practitioner Infectious Diseases Clinical Pharmacist [facility]

## 2023-03-09 NOTE — Progress Notes (Deleted)
HPI: Jose Olsen is a 37 y.o. male who presents to the [name] pharmacy clinic for [place] administration.  Patient Active Problem List   Diagnosis Date Noted   Injection education, encounter for 10/13/2020   Healthcare maintenance 09/15/2020   Screening for STDs (sexually transmitted diseases) 06/16/2020   Medication monitoring encounter 11/04/2019   HTN (hypertension) 05/07/2019   Screening examination for venereal disease 03/28/2016   Encounter for long-term (current) use of high-risk medication 03/28/2016   Human immunodeficiency virus (HIV) disease (HCC) 12/02/2010   ONYCHOMYCOSIS 05/19/2009    [name] Medications  New Prescriptions   No medications on file  Previous Medications   CABOTEGRAVIR & RILPIVIRINE ER (CABENUVA) 400 & 600 MG/2ML INJECTION    Inject 1 kit into the muscle every 30 (thirty) days.   FLUCONAZOLE (DIFLUCAN) 200 MG TABLET    Take 1 tablet (200 mg total) by mouth daily.   TERBINAFINE (LAMISIL) 250 MG TABLET    Take 1 tablet (250 mg total) by mouth daily.  Modified Medications   No medications on file  Discontinued Medications   No medications on file    Allergies: No Known Allergies  Past Medical History: Past Medical History:  Diagnosis Date   Chicken pox    HIV infection (HCC)     Social History: Social History   Socioeconomic History   Marital status: Single    Spouse name: Not on file   Number of children: Not on file   Years of education: Not on file   Highest education level: Not on file  Occupational History   Not on file  Tobacco Use   Smoking status: Never   Smokeless tobacco: Never  Substance and Sexual Activity   Alcohol use: Yes    Comment: rarely   Drug use: No    Frequency: 3.0 times per week   Sexual activity: Yes    Partners: Male    Birth control/protection: Condom    Comment: declined condoms  Other Topics Concern   Not on file  Social History Narrative   Occupation:  Unemployed   Never Smoked   Alcohol  use-yes   Regular exercise-no   Social Drivers of Corporate investment banker Strain: Not on file  Food Insecurity: Not on file  Transportation Needs: Not on file  Physical Activity: Not on file  Stress: Not on file  Social Connections: Not on file    Labs: Lab Results  Component Value Date   HIV1RNAQUANT Not Detected 10/19/2022   HIV1RNAQUANT Not Detected 06/07/2022   HIV1RNAQUANT Not Detected 02/14/2022   CD4TABS 815 10/19/2022   CD4TABS 753 08/12/2021   CD4TABS 655 12/16/2020    RPR and STI Lab Results  Component Value Date   LABRPR NON-REACTIVE 10/19/2022   LABRPR NON-REACTIVE 08/12/2021   LABRPR NON-REACTIVE 12/16/2020   LABRPR NON-REACTIVE 06/16/2020   LABRPR NON-REACTIVE 05/07/2019    STI Results GC GC CT CT  Latest Ref Rng & Units  NEGATIVE  NEGATIVE  10/19/2022  2:01 PM Negative    Negative    Negative   Negative    Negative    Negative    08/12/2021 10:14 AM Negative    Negative    Negative   Negative    Negative    Negative    02/15/2021  9:25 AM Negative   Negative    06/16/2020 10:36 AM Negative    Negative    Negative   Negative    Negative    Negative  05/07/2019  9:59 AM Negative    Negative    Negative   Negative    Negative    Negative    08/14/2018 12:00 AM Negative   Negative    10/10/2013  2:34 AM  POSITIVE   NEGATIVE     Hepatitis B Lab Results  Component Value Date   HEPBSAB REACTIVE (A) 12/16/2020   HEPBSAG NEGATIVE 11/11/2010   Hepatitis C Lab Results  Component Value Date   HEPCAB NON-REACTIVE 12/16/2020   Hepatitis A Lab Results  Component Value Date   HAV REACTIVE (A) 12/16/2020   Lipids: Lab Results  Component Value Date   CHOL 175 10/19/2022   TRIG 187 (H) 10/19/2022   HDL 34 (L) 10/19/2022   CHOLHDL 5.1 (H) 10/19/2022   VLDL 19 12/14/2012   LDLCALC 111 (H) 10/19/2022    TARGET DATE:  The 26th of the month  Assessment: [name] presents today for their maintenance Cabenuva injections. Initial/past  injections were tolerated well without issues. No problems with systemic effects of injections.   Administered cabotegravir 400mg /5mL in left upper outer quadrant of the gluteal muscle. Administered rilpivirine 600 mg/52mL in the right upper outer quadrant of the gluteal muscle. Monitored patient for 10 minutes after injection. Injections were tolerated well without issue. Patient will follow up in 1 month for next injection. Will defer HIV RNA to his next visit.   Plan: - Cabenuva injections administered - Next injections scheduled for 1/28 with Dr. Luciana Axe and *** with *** - Call with any issues or questions  Margarite Gouge, PharmD, CPP, BCIDP, AAHIVP Clinical Pharmacist Practitioner Infectious Diseases Clinical Pharmacist [facility]

## 2023-04-06 ENCOUNTER — Telehealth: Payer: Self-pay

## 2023-04-06 DIAGNOSIS — B351 Tinea unguium: Secondary | ICD-10-CM

## 2023-04-06 MED ORDER — TERBINAFINE HCL 250 MG PO TABS: 250.0000 mg | ORAL_TABLET | Freq: Every day | ORAL | 1 refills | Status: DC

## 2023-04-06 NOTE — Telephone Encounter (Signed)
Pt was on reschedule list for an upcoming appt with Dr. Luciana Axe, who will be out of office. Rescheduled appt w/ Pharm Team in the meantime to keep within TD.   Pt did state that he is in need of a prescription to be sent to another pharmacy due to his insurance he is no longer able to have it filled at [facility]. He specifically stated the Lamisil needed to be changed over to [facility]. Could you possibly assist?   [facility] - [address], [place], [place] [address]

## 2023-04-06 NOTE — Addendum Note (Signed)
Addended by: Jennette Kettle on: 04/06/2023 09:03 AM   Modules accepted: Orders

## 2023-04-06 NOTE — Telephone Encounter (Signed)
Just sent it over to that [facility] pharmacy. Thank you!

## 2023-04-10 ENCOUNTER — Other Ambulatory Visit (HOSPITAL_COMMUNITY): Payer: Self-pay

## 2023-04-10 ENCOUNTER — Other Ambulatory Visit: Payer: Self-pay

## 2023-04-10 ENCOUNTER — Other Ambulatory Visit: Payer: Self-pay | Admitting: Pharmacist

## 2023-04-10 DIAGNOSIS — B20 Human immunodeficiency virus [HIV] disease: Secondary | ICD-10-CM

## 2023-04-10 MED ORDER — CABOTEGRAVIR & RILPIVIRINE ER 400 & 600 MG/2ML IM SUER
1.0000 | INTRAMUSCULAR | 11 refills | Status: DC
  Filled 2023-04-10: qty 4, 30d supply, fill #0
  Filled 2023-05-01: qty 4, 30d supply, fill #1
  Filled 2023-05-29: qty 4, 30d supply, fill #2
  Filled 2023-06-26: qty 4, 30d supply, fill #3
  Filled 2023-07-24: qty 4, 30d supply, fill #4
  Filled 2023-08-29: qty 4, 30d supply, fill #5
  Filled 2023-10-02: qty 4, 30d supply, fill #6
  Filled 2023-11-06: qty 4, 30d supply, fill #7
  Filled 2023-12-04: qty 4, 30d supply, fill #8
  Filled 2023-12-29: qty 4, 30d supply, fill #9
  Filled 2024-01-31: qty 4, 30d supply, fill #10
  Filled 2024-02-21: qty 4, 30d supply, fill #11

## 2023-04-10 NOTE — Progress Notes (Signed)
Specialty Pharmacy Refill Coordination Note  Jose Olsen is a 38 y.o. male assessed today regarding refills of clinic administered specialty medication(s) Cabotegravir & Rilpivirine [facility])   Clinic requested Courier to Provider Office   Delivery date: 04/13/23   Verified address: [address] [place] [address]   Medication will be filled on 04/12/23.

## 2023-04-12 ENCOUNTER — Other Ambulatory Visit: Payer: Self-pay

## 2023-04-13 ENCOUNTER — Telehealth: Payer: Self-pay

## 2023-04-13 NOTE — Telephone Encounter (Signed)
 [name] Patient Advocate Encounter  [name] medications CABENUVA have been couriered to [name] from [facility] and will be administered at the patients appointment on 04/18/23.  Kae Heller, CPhT Specialty Pharmacy Patient [facility] Phone: [phone] Fax:  [phone]

## 2023-04-17 NOTE — Progress Notes (Unsigned)
HPI: Jose Olsen is a 38 y.o. male who presents to the [name] pharmacy clinic for [place] administration.  Patient Active Problem List   Diagnosis Date Noted   Injection education, encounter for 10/13/2020   Healthcare maintenance 09/15/2020   Screening for STDs (sexually transmitted diseases) 06/16/2020   Medication monitoring encounter 11/04/2019   HTN (hypertension) 05/07/2019   Screening examination for venereal disease 03/28/2016   Encounter for long-term (current) use of high-risk medication 03/28/2016   Human immunodeficiency virus (HIV) disease (HCC) 12/02/2010   ONYCHOMYCOSIS 05/19/2009    [name] Medications  New Prescriptions   No medications on file  Previous Medications   CABOTEGRAVIR & RILPIVIRINE ER (CABENUVA) 400 & 600 MG/2ML INJECTION    Inject 1 kit into the muscle every 30 (thirty) days.   FLUCONAZOLE (DIFLUCAN) 200 MG TABLET    Take 1 tablet (200 mg total) by mouth daily.   TERBINAFINE (LAMISIL) 250 MG TABLET    Take 1 tablet (250 mg total) by mouth daily.  Modified Medications   No medications on file  Discontinued Medications   No medications on file    Allergies: No Known Allergies  Labs: Lab Results  Component Value Date   HIV1RNAQUANT Not Detected 10/19/2022   HIV1RNAQUANT Not Detected 06/07/2022   HIV1RNAQUANT Not Detected 02/14/2022   CD4TABS 815 10/19/2022   CD4TABS 753 08/12/2021   CD4TABS 655 12/16/2020    RPR and STI Lab Results  Component Value Date   LABRPR NON-REACTIVE 10/19/2022   LABRPR NON-REACTIVE 08/12/2021   LABRPR NON-REACTIVE 12/16/2020   LABRPR NON-REACTIVE 06/16/2020   LABRPR NON-REACTIVE 05/07/2019    STI Results GC GC CT CT  Latest Ref Rng & Units  NEGATIVE  NEGATIVE  10/19/2022  2:01 PM Negative    Negative    Negative   Negative    Negative    Negative    08/12/2021 10:14 AM Negative    Negative    Negative   Negative    Negative    Negative    02/15/2021  9:25 AM Negative   Negative     06/16/2020 10:36 AM Negative    Negative    Negative   Negative    Negative    Negative    05/07/2019  9:59 AM Negative    Negative    Negative   Negative    Negative    Negative    08/14/2018 12:00 AM Negative   Negative    10/10/2013  2:34 AM  POSITIVE   NEGATIVE     Hepatitis B Lab Results  Component Value Date   HEPBSAB REACTIVE (A) 12/16/2020   HEPBSAG NEGATIVE 11/11/2010   Hepatitis C Lab Results  Component Value Date   HEPCAB NON-REACTIVE 12/16/2020   Hepatitis A Lab Results  Component Value Date   HAV REACTIVE (A) 12/16/2020   Lipids: Lab Results  Component Value Date   CHOL 175 10/19/2022   TRIG 187 (H) 10/19/2022   HDL 34 (L) 10/19/2022   CHOLHDL 5.1 (H) 10/19/2022   VLDL 19 12/14/2012   LDLCALC 111 (H) 10/19/2022    TARGET DATE: 26th day of the month  Assessment: Jose Olsen presents today for his maintenance Cabenuva injections. Past injections were tolerated well without issues. Last HIV RNA was undetected and CD4 was 815 from 10/19/22. Will collect and update HIV RNA level today. Last STI testing was from 10/19/22 and was negative for syphilis/gonorrhea/chlamydia with RPR and oral/urine/rectal cytologies. Denies signs and symptoms of STI today.  Declined to full STI testing today.   In addition, on last visit on 03/09/23, patient requested a refill for his terbinafine for his onychomycosis, stating he usually receives a 70-month day supply for the same condition in the past but he only received a 2 week supply this time. We sent a 10-week day supply refill. Today, patient reports not having the chance to start the additional 10-week supply still due to pharmacy coverage issues. He states this is now resolved and that he will pick up his prescription some time this week.   Labs: From 10/19/22, CMP was normal except elevated Scr of 1.36. Will collect and update CMP today since last CMP was about 6 months ago. Lipid panel from 10/19/22 showed elevated triglycerides  (137) and LDL (111). CBC from the same date was normal.   Vaccination: up to date on all of his vaccines.  Administered cabotegravir 600mg /32mL in left upper outer quadrant of the gluteal muscle. Administered rilpivirine 900 mg/33mL in the right upper outer quadrant of the gluteal muscle. No issues with injections. He will follow up in 2 months for next set of injections.  Plan: - Cabenuva injections administered - HIV RNA and CMP today - Next injections scheduled for 05/16/23 with Dr. Luciana Axe and on 06/07/23 with Marchelle Folks - Call with any issues or questions  Dimple Casey. Edwena Blow, PharmD Candidate [employer]

## 2023-04-18 ENCOUNTER — Ambulatory Visit: Payer: Self-pay | Admitting: Internal Medicine

## 2023-04-18 ENCOUNTER — Ambulatory Visit (INDEPENDENT_AMBULATORY_CARE_PROVIDER_SITE_OTHER): Payer: 59 | Admitting: Pharmacist

## 2023-04-18 ENCOUNTER — Other Ambulatory Visit: Payer: Self-pay

## 2023-04-18 DIAGNOSIS — B20 Human immunodeficiency virus [HIV] disease: Secondary | ICD-10-CM | POA: Diagnosis not present

## 2023-04-18 MED ORDER — CABOTEGRAVIR & RILPIVIRINE ER 400 & 600 MG/2ML IM SUER
1.0000 | Freq: Once | INTRAMUSCULAR | Status: AC
  Administered 2023-04-18: 1 via INTRAMUSCULAR

## 2023-04-20 ENCOUNTER — Ambulatory Visit
Admission: EM | Admit: 2023-04-20 | Discharge: 2023-04-20 | Disposition: A | Discharge status: Home / Self Care | Payer: 59 | Attending: Family Medicine | Admitting: Family Medicine

## 2023-04-20 DIAGNOSIS — S29012A Strain of muscle and tendon of back wall of thorax, initial encounter: Secondary | ICD-10-CM | POA: Diagnosis not present

## 2023-04-20 LAB — COMPREHENSIVE METABOLIC PANEL
AG Ratio: 2 (calc) (ref 1.0–2.5)
ALT: 17 U/L (ref 9–46)
AST: 13 U/L (ref 10–40)
Albumin: 4.6 g/dL (ref 3.6–5.1)
Alkaline phosphatase (APISO): 59 U/L (ref 36–130)
BUN: 17 mg/dL (ref 7–25)
CO2: 28 mmol/L (ref 20–32)
Calcium: 10 mg/dL (ref 8.6–10.3)
Chloride: 106 mmol/L (ref 98–110)
Creat: 1.1 mg/dL (ref 0.60–1.26)
Globulin: 2.3 g/dL (ref 1.9–3.7)
Glucose, Bld: 101 mg/dL — ABNORMAL HIGH (ref 65–99)
Potassium: 4.1 mmol/L (ref 3.5–5.3)
Sodium: 142 mmol/L (ref 135–146)
Total Bilirubin: 0.4 mg/dL (ref 0.2–1.2)
Total Protein: 6.9 g/dL (ref 6.1–8.1)

## 2023-04-20 LAB — HIV-1 RNA QUANT-NO REFLEX-BLD
HIV 1 RNA Quant: NOT DETECTED {copies}/mL
HIV-1 RNA Quant, Log: NOT DETECTED {Log}

## 2023-04-20 MED ORDER — CYCLOBENZAPRINE HCL 10 MG PO TABS: 10.0000 mg | ORAL_TABLET | Freq: Two times a day (BID) | ORAL | 0 refills | Status: DC | PRN

## 2023-04-20 MED ORDER — NAPROXEN 500 MG PO TABS: 500.0000 mg | ORAL_TABLET | Freq: Two times a day (BID) | ORAL | 0 refills | Status: DC | PRN

## 2023-04-20 MED ORDER — KETOROLAC TROMETHAMINE 30 MG/ML IJ SOLN
30.0000 mg | Freq: Once | INTRAMUSCULAR | Status: AC
  Administered 2023-04-20: 30 mg via INTRAMUSCULAR

## 2023-04-20 NOTE — Discharge Instructions (Addendum)
You were given a Toradol injection in clinic today. Do not take any over the counter NSAID's such as Advil, ibuprofen, Aleve, or naproxen for 24 hours.  You may take tylenol if needed.  May start Flexeril today as needed.  Please note this medication can make you drowsy.  Do not drink alcohol or drive while on this medication.  You may start naproxen twice daily as needed tomorrow, 1/31.  Continue heat and rest.  Please follow-up with your PCP if your symptoms do not improve.  Please go to the ER if you develop any worsening symptoms.  I hope you feel better soon!

## 2023-04-20 NOTE — ED Provider Notes (Signed)
[facility]    CSN: [id number] Arrival date & time: 04/20/23  7829      History   Chief Complaint No chief complaint on file.   HPI Jose Olsen is a 38 y.o. male presents for back pain.  Patient reports yesterday he was picking up some Styrofoam cups when he felt a pain in his mid/upper back that has been intermittent since that time.  Occurs primarily with bending over or certain movements.  Pain is nonradiating.  Denies any numbness/tingling/weakness of his upper or lower extremities.  No neck pain.  No injury such as fall.  Has a history of sciatica but no history of upper back pain/surgery/injuries.  He did take some Tylenol and using heating pad with minimal improvement.  No other concerns at this time.  HPI  Past Medical History:  Diagnosis Date   Chicken pox    HIV infection (HCC)     Patient Active Problem List   Diagnosis Date Noted   Injection education, encounter for 10/13/2020   Healthcare maintenance 09/15/2020   Screening for STDs (sexually transmitted diseases) 06/16/2020   Medication monitoring encounter 11/04/2019   HTN (hypertension) 05/07/2019   Screening examination for venereal disease 03/28/2016   Encounter for long-term (current) use of high-risk medication 03/28/2016   Human immunodeficiency virus (HIV) disease (HCC) 12/02/2010   ONYCHOMYCOSIS 05/19/2009    History reviewed. No pertinent surgical history.     Home Medications    Prior to Admission medications   Medication Sig Start Date End Date Taking? Authorizing Provider  cabotegravir & rilpivirine ER (CABENUVA) 400 & 600 MG/2ML injection Inject 1 kit into the muscle every 30 (thirty) days. 04/10/23  Yes Kuppelweiser, [name] L, RPH-CPP  cyclobenzaprine (FLEXERIL) 10 MG tablet Take 1 tablet (10 mg total) by mouth 2 (two) times daily as needed for muscle spasms. 04/20/23  Yes Radford Pax, NP  naproxen (NAPROSYN) 500 MG tablet Take 1 tablet (500 mg total) by mouth 2 (two) times daily  as needed. 04/20/23  Yes Radford Pax, NP  terbinafine (LAMISIL) 250 MG tablet Take 1 tablet (250 mg total) by mouth daily. 04/06/23  Yes Jennette Kettle, RPH-CPP  fluconazole (DIFLUCAN) 200 MG tablet Take 1 tablet (200 mg total) by mouth daily. Patient not taking: Reported on 04/19/2022 01/10/22   Kuppelweiser, Cherylann Ratel, RPH-CPP    Family History Family History  Problem Relation Age of Onset   Diabetes Paternal Uncle    Diabetes Maternal Grandmother    Hypertension Maternal Grandmother    Stroke Maternal Grandmother    Cancer Other        prostate    Social History Social History   Tobacco Use   Smoking status: Never   Smokeless tobacco: Never  Substance Use Topics   Alcohol use: Yes    Comment: rarely   Drug use: No    Frequency: 3.0 times per week     Allergies   Patient has no known allergies.   Review of Systems Review of Systems  Musculoskeletal:  Positive for back pain.     Physical Exam Triage Vital Signs ED Triage Vitals  Encounter Vitals Group     BP 04/20/23 1137 (!) 131/96     Systolic BP Percentile --      Diastolic BP Percentile --      Pulse Rate 04/20/23 1137 75     Resp 04/20/23 1137 16     Temp 04/20/23 1137 97.8 F (36.6 C)  Temp Source 04/20/23 1137 Oral     SpO2 04/20/23 1137 97 %     Weight --      Height --      Head Circumference --      Peak Flow --      Pain Score 04/20/23 1131 4     Pain Loc --      Pain Education --      Exclude from Growth Chart --    No data found.  Updated Vital Signs BP (!) 131/96 (BP Location: Left Arm)   Pulse 75   Temp 97.8 F (36.6 C) (Oral)   Resp 16   SpO2 97%   Visual Acuity Right Eye Distance:   Left Eye Distance:   Bilateral Distance:    Right Eye Near:   Left Eye Near:    Bilateral Near:     Physical Exam Vitals and nursing note reviewed.  Constitutional:      General: He is not in acute distress.    Appearance: Normal appearance. He is not ill-appearing.  HENT:      Head: Normocephalic and atraumatic.  Eyes:     Pupils: Pupils are equal, round, and reactive to light.  Cardiovascular:     Rate and Rhythm: Normal rate.  Pulmonary:     Effort: Pulmonary effort is normal.  Musculoskeletal:     Cervical back: Normal.     Thoracic back: Spasms and tenderness present. No swelling, edema, deformity, signs of trauma, lacerations or bony tenderness. Normal range of motion. No scoliosis.     Lumbar back: Normal.       Back:     Comments: Strength 5 out of 5 bilateral upper extremities  Skin:    General: Skin is warm and dry.  Neurological:     General: No focal deficit present.     Mental Status: He is alert and oriented to person, place, and time.  Psychiatric:        Mood and Affect: Mood normal.        Behavior: Behavior normal.      UC Treatments / Results  Labs (all labs ordered are listed, but only abnormal results are displayed) Labs Reviewed - No data to display  Status: Final result     Next appt: 05/16/2023 at 10:30 AM in Infectious Diseases Gardiner Barefoot, MD)     Dx: Human immunodeficiency virus (HIV) di...   Test Result Released: Yes (seen)   0 Result Notes          Component Ref Range & Units (hover) 2 d ago (04/18/23) 6 mo ago (10/19/22) 1 yr ago (08/12/21) 3 yr ago (12/03/19) 3 yr ago (10/25/19) 3 yr ago (05/07/19) 4 yr ago (08/14/18)  Glucose, Bld 101 High  89 CM 89 CM  91 CM 91 CM 119 High  CM  Comment: .            Fasting reference interval . For someone without known diabetes, a glucose value between 100 and 125 mg/dL is consistent with prediabetes and should be confirmed with a follow-up test. .  BUN 17 15 11  13 19 18   Creat 1.10 1.36 High  1.00  1.06 R 1.11 R 0.95 R  BUN/Creatinine Ratio SEE NOTE: 11 NOT APPLICABLE  NOT APPLICABLE NOT APPLICABLE NOT APPLICABLE  Comment:    Not Reported: BUN and Creatinine are within    reference range. .  Sodium 142 140 142  141 142 140  Potassium  4.1 4.1 4.2  4.1 4.1 4.0   Chloride 106 107 106  106 104 106  CO2 28 26 29  26 30 29   Calcium 10.0 9.5 9.8  9.5 10.1 9.9  Total Protein 6.9 7.0 7.4 6.8 7.0 7.1 7.5  Albumin 4.6 4.7 4.7 4.3 4.4 4.7 4.7  Globulin 2.3 2.3 2.7 2.5 2.6 2.4 2.8  AG Ratio 2.0 2.0 1.7 1.7 1.7 2.0 1.7  Total Bilirubin 0.4 0.4 0.7 0.5 0.6 0.4 0.6  Alkaline phosphatase (APISO) 59 58 59 60 58 69 72  AST 13 13 15 14 15 13 17   ALT 17 13 13 17 15 15 24   Resulting Agency [facility]         EKG   Radiology No results found.  Procedures Procedures (including critical care time)  Medications Ordered in UC Medications  ketorolac (TORADOL) 30 MG/ML injection 30 mg (30 mg Intramuscular Given 04/20/23 1211)    Initial Impression / Assessment and Plan / UC Course  I have reviewed the triage vital signs and the nursing notes.  Pertinent labs & imaging results that were available during my care of the patient were reviewed by me and considered in my medical decision making (see chart for details).     Reviewed exam and symptoms with patient.  No red flags.  Discussed upper back strain.  Patient given Toradol injection in clinic.  Monitored for 10 minutes after injection with no reaction noted and tolerated well.  Instructed no NSAIDs for 24 hours and verbalized understanding.  Trial of Flexeril, side effect profile reviewed.  Continue OTC Tylenol today as needed.  Start Rx naproxen tomorrow, 1/31.  Continue heat and rest.  PCP follow-up if symptoms do not improve.  ER precautions reviewed. Final Clinical Impressions(s) / UC Diagnoses   Final diagnoses:  Upper back strain, initial encounter     Discharge Instructions      You were given a Toradol injection in clinic today. Do not take any over the counter NSAID's such as Advil, ibuprofen, Aleve, or naproxen for 24 hours.  You may take tylenol if needed.  May start Flexeril today as needed.  Please note this  medication can make you drowsy.  Do not drink alcohol or drive while on this medication.  You may start naproxen twice daily as needed tomorrow, 1/31.  Continue heat and rest.  Please follow-up with your PCP if your symptoms do not improve.  Please go to the ER if you develop any worsening symptoms.  I hope you feel better soon!     ED Prescriptions     Medication Sig Dispense Auth. Provider   cyclobenzaprine (FLEXERIL) 10 MG tablet Take 1 tablet (10 mg total) by mouth 2 (two) times daily as needed for muscle spasms. 10 tablet Radford Pax, NP   naproxen (NAPROSYN) 500 MG tablet Take 1 tablet (500 mg total) by mouth 2 (two) times daily as needed. 14 tablet Radford Pax, NP      PDMP not reviewed this encounter.   Radford Pax, NP 04/20/23 [phone]

## 2023-04-20 NOTE — ED Triage Notes (Signed)
Pt presents to UC for c/o upper back pain since yesterday.  Pt states he was picking up something when it started. Has used heating pad w/o relief. Took tylenol

## 2023-05-01 ENCOUNTER — Other Ambulatory Visit: Payer: Self-pay

## 2023-05-01 ENCOUNTER — Other Ambulatory Visit (HOSPITAL_COMMUNITY): Payer: Self-pay

## 2023-05-01 NOTE — Progress Notes (Signed)
 Specialty Pharmacy Refill Coordination Note  Jose Olsen is a 38 y.o. male assessed today regarding refills of clinic administered specialty medication(s) Cabotegravir  & Rilpivirine  (CABENUVA )   Clinic requested Courier to Provider Office   Delivery date: 05/15/23   Verified address: [address] [place] [address]   Medication will be filled on 05/12/23.

## 2023-05-15 ENCOUNTER — Telehealth: Payer: Self-pay

## 2023-05-15 NOTE — Telephone Encounter (Signed)
 [name] Patient Advocate Encounter  [name] medications CABENUVA have been couriered to [name] from [facility] and will be administered at the patients appointment on 05/16/23.  Kae Heller, CPhT Specialty Pharmacy Patient [facility] for Infectious Disease Phone: [phone] Fax:  [phone]

## 2023-05-16 ENCOUNTER — Encounter: Payer: Self-pay | Admitting: Internal Medicine

## 2023-05-16 ENCOUNTER — Other Ambulatory Visit: Payer: Self-pay

## 2023-05-16 ENCOUNTER — Ambulatory Visit (INDEPENDENT_AMBULATORY_CARE_PROVIDER_SITE_OTHER): Payer: 59 | Admitting: Internal Medicine

## 2023-05-16 VITALS — BP 121/87 | HR 72 | Temp 98.1°F | Ht 70.0 in | Wt 202.0 lb

## 2023-05-16 DIAGNOSIS — B20 Human immunodeficiency virus [HIV] disease: Secondary | ICD-10-CM | POA: Diagnosis not present

## 2023-05-16 MED ORDER — CABOTEGRAVIR & RILPIVIRINE ER 400 & 600 MG/2ML IM SUER
1.0000 | Freq: Once | INTRAMUSCULAR | Status: AC
Start: 2023-05-16 — End: 2023-05-16
  Administered 2023-05-16: 1 via INTRAMUSCULAR

## 2023-05-16 NOTE — Addendum Note (Signed)
 Addended by: Philippa Chester on: 05/16/2023 11:05 AM   Modules accepted: Orders

## 2023-05-16 NOTE — Assessment & Plan Note (Signed)
 He continues doing well onCabenuva monthly and no changes indicated.  He has no concerns and no injection site reactions of note.  He has follow-up plan for 1 month.  He I reviewed previous labs with him and he has remained not detected.  No changes indicated.  He can see me in 6 months.  He declined any STI testing as he has been with a stable partner.  I have personally spent 31 minutes involved in face-to-face and non-face-to-face activities for this patient on the day of the visit. Professional time spent includes the following activities: Preparing to see the patient (review of tests), Obtaining and/or reviewing separately obtained history (admission/discharge record), Performing a medically appropriate examination and/or evaluation , Ordering medications/tests/procedures, referring and communicating with other health care professionals, Documenting clinical information in the EMR, Independently interpreting results (not separately reported), Communicating results to the patient/family/caregiver, Counseling and educating the patient/family/caregiver and Care coordination (not separately reported).

## 2023-05-16 NOTE — Progress Notes (Signed)
   Subjective:    Patient ID: Jose Olsen, male    DOB: 1985/03/30, 38 y.o.   MRN: [id number]  HPI Jose Olsen is here for follow-up of HIV. He continues on [place] monthly and has had no new issues.  He feels well and has no issues with coverage for the medication.  Continues to work and is active on his feet at his job.  He continues with the same monogamous partner which she has had for over 5 years.  No other sexual activity.   Review of Systems  Constitutional:  Negative for fatigue.  Gastrointestinal:  Negative for diarrhea.  Skin:  Negative for rash.       Objective:   Physical Exam Eyes:     General: No scleral icterus. Pulmonary:     Effort: Pulmonary effort is normal.  Neurological:     Mental Status: He is alert.   SH: no tobacco        Assessment & Plan:

## 2023-05-17 ENCOUNTER — Ambulatory Visit: Payer: Medicaid Other | Admitting: Pharmacist

## 2023-05-29 ENCOUNTER — Other Ambulatory Visit (HOSPITAL_COMMUNITY): Payer: Self-pay

## 2023-05-29 ENCOUNTER — Other Ambulatory Visit: Payer: Self-pay

## 2023-05-29 NOTE — Progress Notes (Signed)
 Specialty Pharmacy Refill Coordination Note  Jose Olsen is a 38 y.o. male assessed today regarding refills of clinic administered specialty medication(s) Cabotegravir & Rilpivirine [facility])   Clinic requested Courier to Provider Office   Delivery date: 06/05/23   Verified address: [address] [place] [address]   Medication will be filled on 06/02/23.

## 2023-06-02 ENCOUNTER — Other Ambulatory Visit: Payer: Self-pay

## 2023-06-02 NOTE — Progress Notes (Signed)
 HPI: Jose Olsen is a 38 y.o. male who presents to the [name] pharmacy clinic for [place] administration.  Patient Active Problem List   Diagnosis Date Noted   Injection education, encounter for 10/13/2020   Healthcare maintenance 09/15/2020   Screening for STDs (sexually transmitted diseases) 06/16/2020   Medication monitoring encounter 11/04/2019   HTN (hypertension) 05/07/2019   Screening examination for venereal disease 03/28/2016   Encounter for long-term (current) use of high-risk medication 03/28/2016   Human immunodeficiency virus (HIV) disease (HCC) 12/02/2010   ONYCHOMYCOSIS 05/19/2009    [name] Medications  New Prescriptions   No medications on file  Previous Medications   CABOTEGRAVIR & RILPIVIRINE ER (CABENUVA) 400 & 600 MG/2ML INJECTION    Inject 1 kit into the muscle every 30 (thirty) days.   CYCLOBENZAPRINE (FLEXERIL) 10 MG TABLET    Take 1 tablet (10 mg total) by mouth 2 (two) times daily as needed for muscle spasms.   FLUCONAZOLE (DIFLUCAN) 200 MG TABLET    Take 1 tablet (200 mg total) by mouth daily.   NAPROXEN (NAPROSYN) 500 MG TABLET    Take 1 tablet (500 mg total) by mouth 2 (two) times daily as needed.   TERBINAFINE (LAMISIL) 250 MG TABLET    Take 1 tablet (250 mg total) by mouth daily.  Modified Medications   No medications on file  Discontinued Medications   No medications on file    Allergies: No Known Allergies  Past Medical History: Past Medical History:  Diagnosis Date   Chicken pox    HIV infection (HCC)     Social History: Social History   Socioeconomic History   Marital status: Single    Spouse name: Not on file   Number of children: Not on file   Years of education: Not on file   Highest education level: Not on file  Occupational History   Not on file  Tobacco Use   Smoking status: Never   Smokeless tobacco: Never  Substance and Sexual Activity   Alcohol use: Yes    Comment: rarely   Drug use: No    Frequency: 3.0 times  per week   Sexual activity: Yes    Partners: Male    Birth control/protection: Condom    Comment: declined condoms  Other Topics Concern   Not on file  Social History Narrative   Occupation:  Unemployed   Never Smoked   Alcohol use-yes   Regular exercise-no   Social Drivers of Corporate investment banker Strain: Not on file  Food Insecurity: Not on file  Transportation Needs: Not on file  Physical Activity: Not on file  Stress: Not on file  Social Connections: Not on file    Labs: Lab Results  Component Value Date   HIV1RNAQUANT Not Detected 04/18/2023   HIV1RNAQUANT Not Detected 10/19/2022   HIV1RNAQUANT Not Detected 06/07/2022   CD4TABS 815 10/19/2022   CD4TABS 753 08/12/2021   CD4TABS 655 12/16/2020    RPR and STI Lab Results  Component Value Date   LABRPR NON-REACTIVE 10/19/2022   LABRPR NON-REACTIVE 08/12/2021   LABRPR NON-REACTIVE 12/16/2020   LABRPR NON-REACTIVE 06/16/2020   LABRPR NON-REACTIVE 05/07/2019    STI Results GC GC CT CT  Latest Ref Rng & Units  NEGATIVE  NEGATIVE  10/19/2022  2:01 PM Negative    Negative    Negative   Negative    Negative    Negative    08/12/2021 10:14 AM Negative    Negative  Negative   Negative    Negative    Negative    02/15/2021  9:25 AM Negative   Negative    06/16/2020 10:36 AM Negative    Negative    Negative   Negative    Negative    Negative    05/07/2019  9:59 AM Negative    Negative    Negative   Negative    Negative    Negative    08/14/2018 12:00 AM Negative   Negative    10/10/2013  2:34 AM  POSITIVE   NEGATIVE     Hepatitis B Lab Results  Component Value Date   HEPBSAB REACTIVE (A) 12/16/2020   HEPBSAG NEGATIVE 11/11/2010   Hepatitis C Lab Results  Component Value Date   HEPCAB NON-REACTIVE 12/16/2020   Hepatitis A Lab Results  Component Value Date   HAV REACTIVE (A) 12/16/2020   Lipids: Lab Results  Component Value Date   CHOL 175 10/19/2022   TRIG 187 (H) 10/19/2022    HDL 34 (L) 10/19/2022   CHOLHDL 5.1 (H) 10/19/2022   VLDL 19 12/14/2012   LDLCALC 111 (H) 10/19/2022    TARGET DATE:  The 26th of the month  Assessment: Jose Olsen presents today for their maintenance Cabenuva injections. Initial/past injections were tolerated well without issues. No problems with systemic effects of injections.   Administered cabotegravir 400mg /22mL in left upper outer quadrant of the gluteal muscle. Administered rilpivirine 600 mg/37mL in the right upper outer quadrant of the gluteal muscle. Injections were tolerated well without issue. Patient will follow up in 2 months for next injection. Will defer HIV RNA testing as it was checked last month.   Plan: - Cabenuva injections administered - Next injections scheduled for 4/22 and 5/21 with me  - Call with any issues or questions  Margarite Gouge, PharmD, CPP, BCIDP, AAHIVP Clinical Pharmacist Practitioner Infectious Diseases Clinical Pharmacist [facility]

## 2023-06-06 ENCOUNTER — Telehealth: Payer: Self-pay

## 2023-06-06 NOTE — Telephone Encounter (Signed)
 [name] Patient Advocate Encounter  [name] medications CABENUVA have been couriered to [name] from [facility] and will be administered at the patients appointment on 06/07/23.  Kae Heller, CPhT Specialty Pharmacy Patient [facility] for Infectious Disease Phone: [phone] Fax:  [phone]

## 2023-06-07 ENCOUNTER — Ambulatory Visit (INDEPENDENT_AMBULATORY_CARE_PROVIDER_SITE_OTHER): Payer: Self-pay | Admitting: Pharmacist

## 2023-06-07 ENCOUNTER — Other Ambulatory Visit: Payer: Self-pay

## 2023-06-07 DIAGNOSIS — B20 Human immunodeficiency virus [HIV] disease: Secondary | ICD-10-CM

## 2023-06-07 MED ORDER — CABOTEGRAVIR & RILPIVIRINE ER 400 & 600 MG/2ML IM SUER
1.0000 | Freq: Once | INTRAMUSCULAR | Status: AC
  Administered 2023-06-07: 1 via INTRAMUSCULAR

## 2023-06-26 ENCOUNTER — Other Ambulatory Visit: Payer: Self-pay

## 2023-06-26 ENCOUNTER — Other Ambulatory Visit (HOSPITAL_COMMUNITY): Payer: Self-pay

## 2023-06-26 NOTE — Progress Notes (Signed)
 Specialty Pharmacy Refill Coordination Note  Jose Olsen is a 38 y.o. male assessed today regarding refills of clinic administered specialty medication(s) Cabotegravir & Rilpivirine [facility])   Clinic requested Courier to Provider Office   Delivery date: 07/05/23   Verified address: [address] [place] [address]   Medication will be filled on 07/04/23.

## 2023-07-05 ENCOUNTER — Telehealth: Payer: Self-pay

## 2023-07-05 NOTE — Telephone Encounter (Signed)
 [name] Patient Advocate Encounter  [name] medications CABENUVA have been couriered to [name] from [facility] and will be administered at the patients appointment on 07/11/23.  Verline Glow, CPhT Specialty Pharmacy Patient [facility] for Infectious Disease Phone: [phone] Fax:  [phone]

## 2023-07-10 NOTE — Progress Notes (Signed)
 HPI: Jose Olsen is a 38 y.o. male who presents to the [name] pharmacy clinic for Cabenuva  administration.  Patient Active Problem List   Diagnosis Date Noted   Injection education, encounter for 10/13/2020   Healthcare maintenance 09/15/2020   Screening for STDs (sexually transmitted diseases) 06/16/2020   Medication monitoring encounter 11/04/2019   HTN (hypertension) 05/07/2019   Screening examination for venereal disease 03/28/2016   Encounter for long-term (current) use of high-risk medication 03/28/2016   Human immunodeficiency virus (HIV) disease (HCC) 12/02/2010   ONYCHOMYCOSIS 05/19/2009    [name] Medications  New Prescriptions   No medications on file  Previous Medications   CABOTEGRAVIR  & RILPIVIRINE  ER (CABENUVA ) 400 & 600 MG/2ML INJECTION    Inject 1 kit into the muscle every 30 (thirty) days.   CYCLOBENZAPRINE  (FLEXERIL ) 10 MG TABLET    Take 1 tablet (10 mg total) by mouth 2 (two) times daily as needed for muscle spasms.   FLUCONAZOLE  (DIFLUCAN ) 200 MG TABLET    Take 1 tablet (200 mg total) by mouth daily.   NAPROXEN  (NAPROSYN ) 500 MG TABLET    Take 1 tablet (500 mg total) by mouth 2 (two) times daily as needed.   TERBINAFINE  (LAMISIL ) 250 MG TABLET    Take 1 tablet (250 mg total) by mouth daily.  Modified Medications   No medications on file  Discontinued Medications   No medications on file    Allergies: No Known Allergies  Labs: Lab Results  Component Value Date   HIV1RNAQUANT Not Detected 04/18/2023   HIV1RNAQUANT Not Detected 10/19/2022   HIV1RNAQUANT Not Detected 06/07/2022   CD4TABS 815 10/19/2022   CD4TABS 753 08/12/2021   CD4TABS 655 12/16/2020    RPR and STI Lab Results  Component Value Date   LABRPR NON-REACTIVE 10/19/2022   LABRPR NON-REACTIVE 08/12/2021   LABRPR NON-REACTIVE 12/16/2020   LABRPR NON-REACTIVE 06/16/2020   LABRPR NON-REACTIVE 05/07/2019    STI Results GC GC CT CT  Latest Ref Rng & Units  NEGATIVE  NEGATIVE   10/19/2022  2:01 PM Negative    Negative    Negative   Negative    Negative    Negative    08/12/2021 10:14 AM Negative    Negative    Negative   Negative    Negative    Negative    02/15/2021  9:25 AM Negative   Negative    06/16/2020 10:36 AM Negative    Negative    Negative   Negative    Negative    Negative    05/07/2019  9:59 AM Negative    Negative    Negative   Negative    Negative    Negative    08/14/2018 12:00 AM Negative   Negative    10/10/2013  2:34 AM  POSITIVE   NEGATIVE     Hepatitis B Lab Results  Component Value Date   HEPBSAB REACTIVE (A) 12/16/2020   HEPBSAG NEGATIVE 11/11/2010   Hepatitis C Lab Results  Component Value Date   HEPCAB NON-REACTIVE 12/16/2020   Hepatitis A Lab Results  Component Value Date   HAV REACTIVE (A) 12/16/2020   Lipids: Lab Results  Component Value Date   CHOL 175 10/19/2022   TRIG 187 (H) 10/19/2022   HDL 34 (L) 10/19/2022   CHOLHDL 5.1 (H) 10/19/2022   VLDL 19 12/14/2012   LDLCALC 111 (H) 10/19/2022    TARGET DATE: 26th  Assessment: Jose Olsen presents today for 1 month maintenance Cabenuva  injections. Past injections  were tolerated well without issues. Last HIV RNA was undetectable in 03/2023. Doing well with no issues today.  Administered cabotegravir  400mg /51mL in left upper outer quadrant of the gluteal muscle. Administered rilpivirine  600 mg/2mL in the right upper outer quadrant of the gluteal muscle. No issues with injections. Aswad will follow up in 1 month for next set of injections.  Immunizations: He is eligible for the shingles vaccination which he politely declines today.  Plan: - Cabenuva  injections administered - Next injections scheduled for 08/09/2023 with Mylinda Asa and 09/12/2023 with Dr. Zelda Hickman  - Call with any issues or questions  Barbra Boone, PharmD PGY1 Acute Care Pharmacy Resident  07/11/2023 9:47 AM

## 2023-07-11 ENCOUNTER — Ambulatory Visit: Admitting: Pharmacist

## 2023-07-11 ENCOUNTER — Other Ambulatory Visit: Payer: Self-pay

## 2023-07-11 DIAGNOSIS — B20 Human immunodeficiency virus [HIV] disease: Secondary | ICD-10-CM | POA: Diagnosis not present

## 2023-07-11 MED ORDER — CABOTEGRAVIR & RILPIVIRINE ER 400 & 600 MG/2ML IM SUER
1.0000 | Freq: Once | INTRAMUSCULAR | Status: AC
  Administered 2023-07-11: 1 via INTRAMUSCULAR

## 2023-07-24 ENCOUNTER — Other Ambulatory Visit (HOSPITAL_COMMUNITY): Payer: Self-pay

## 2023-07-24 ENCOUNTER — Other Ambulatory Visit: Payer: Self-pay

## 2023-07-24 NOTE — Progress Notes (Signed)
 Specialty Pharmacy Refill Coordination Note  Keyvan Deveaux is a 38 y.o. male assessed today regarding refills of clinic administered specialty medication(s) Cabotegravir  & Rilpivirine  (CABENUVA )   Clinic requested Courier to Provider Office   Delivery date: 08/07/23   Verified address: [address] [place] [address]   Medication will be filled on 08/04/23.

## 2023-08-01 ENCOUNTER — Other Ambulatory Visit (HOSPITAL_COMMUNITY): Payer: Self-pay

## 2023-08-07 ENCOUNTER — Telehealth: Payer: Self-pay

## 2023-08-07 NOTE — Telephone Encounter (Signed)
 [name] Patient Advocate Encounter  [name] medications Cabenuva  have been couriered to [name] from [facility] and will be administered at the patients appointment on 08/09/23.  Jose Olsen, CPhT Specialty Pharmacy Patient [facility] for Infectious Disease Phone: [phone] Fax:  [phone]

## 2023-08-08 NOTE — Progress Notes (Signed)
 HPI: Jose Olsen is a 38 y.o. male who presents to the [name] pharmacy clinic for Cabenuva  administration.  Patient Active Problem List   Diagnosis Date Noted   Injection education, encounter for 10/13/2020   Healthcare maintenance 09/15/2020   Screening for STDs (sexually transmitted diseases) 06/16/2020   Medication monitoring encounter 11/04/2019   HTN (hypertension) 05/07/2019   Screening examination for venereal disease 03/28/2016   Encounter for long-term (current) use of high-risk medication 03/28/2016   Human immunodeficiency virus (HIV) disease (HCC) 12/02/2010   ONYCHOMYCOSIS 05/19/2009    [name] Medications  New Prescriptions   No medications on file  Previous Medications   CABOTEGRAVIR  & RILPIVIRINE  ER (CABENUVA ) 400 & 600 MG/2ML INJECTION    Inject 1 kit into the muscle every 30 (thirty) days.   CYCLOBENZAPRINE  (FLEXERIL ) 10 MG TABLET    Take 1 tablet (10 mg total) by mouth 2 (two) times daily as needed for muscle spasms.   NAPROXEN  (NAPROSYN ) 500 MG TABLET    Take 1 tablet (500 mg total) by mouth 2 (two) times daily as needed.   TERBINAFINE  (LAMISIL ) 250 MG TABLET    Take 1 tablet (250 mg total) by mouth daily.  Modified Medications   No medications on file  Discontinued Medications   No medications on file    Allergies: No Known Allergies  Labs: Lab Results  Component Value Date   HIV1RNAQUANT Not Detected 04/18/2023   HIV1RNAQUANT Not Detected 10/19/2022   HIV1RNAQUANT Not Detected 06/07/2022   CD4TABS 815 10/19/2022   CD4TABS 753 08/12/2021   CD4TABS 655 12/16/2020    RPR and STI Lab Results  Component Value Date   LABRPR NON-REACTIVE 10/19/2022   LABRPR NON-REACTIVE 08/12/2021   LABRPR NON-REACTIVE 12/16/2020   LABRPR NON-REACTIVE 06/16/2020   LABRPR NON-REACTIVE 05/07/2019    STI Results GC GC CT CT  Latest Ref Rng & Units  NEGATIVE  NEGATIVE  10/19/2022  2:01 PM Negative    Negative    Negative   Negative    Negative    Negative     08/12/2021 10:14 AM Negative    Negative    Negative   Negative    Negative    Negative    02/15/2021  9:25 AM Negative   Negative    06/16/2020 10:36 AM Negative    Negative    Negative   Negative    Negative    Negative    05/07/2019  9:59 AM Negative    Negative    Negative   Negative    Negative    Negative    08/14/2018 12:00 AM Negative   Negative    10/10/2013  2:34 AM  POSITIVE   NEGATIVE     Hepatitis B Lab Results  Component Value Date   HEPBSAB REACTIVE (A) 12/16/2020   HEPBSAG NEGATIVE 11/11/2010   Hepatitis C Lab Results  Component Value Date   HEPCAB NON-REACTIVE 12/16/2020   Hepatitis A Lab Results  Component Value Date   HAV REACTIVE (A) 12/16/2020   Lipids: Lab Results  Component Value Date   CHOL 175 10/19/2022   TRIG 187 (H) 10/19/2022   HDL 34 (L) 10/19/2022   CHOLHDL 5.1 (H) 10/19/2022   VLDL 19 12/14/2012   LDLCALC 111 (H) 10/19/2022    TARGET DATE: 26th of the month  Assessment: Jovita Nipper presents today for his 1 month maintenance Cabenuva  injections. Past injections were tolerated well without issues. Last HIV RNA was undetectable in January. Doing well with  no issues today.  Administered cabotegravir  400mg /11mL in left upper outer quadrant of the gluteal muscle. Administered rilpivirine  600 mg/2mL in the right upper outer quadrant of the gluteal muscle. No issues with injections. Macari will follow up in 1 month for next set of injections.  Plan: - Cabenuva  injections administered - Next injections scheduled for 6/24 with Dr. Zelda Hickman, 7/30 with Mylinda Asa - Call with any issues or questions   Valarie Garner, PharmD PGY1 Pharmacy Resident

## 2023-08-09 ENCOUNTER — Other Ambulatory Visit: Payer: Self-pay

## 2023-08-09 ENCOUNTER — Ambulatory Visit: Payer: Self-pay | Admitting: Pharmacist

## 2023-08-09 DIAGNOSIS — B20 Human immunodeficiency virus [HIV] disease: Secondary | ICD-10-CM | POA: Diagnosis not present

## 2023-08-09 MED ORDER — CABOTEGRAVIR & RILPIVIRINE ER 400 & 600 MG/2ML IM SUER
1.0000 | Freq: Once | INTRAMUSCULAR | Status: AC
  Administered 2023-08-09: 1 via INTRAMUSCULAR

## 2023-08-29 ENCOUNTER — Other Ambulatory Visit (HOSPITAL_COMMUNITY): Payer: Self-pay

## 2023-08-29 ENCOUNTER — Other Ambulatory Visit: Payer: Self-pay

## 2023-08-29 NOTE — Progress Notes (Signed)
 Specialty Pharmacy Refill Coordination Note  Edker Punt is a 38 y.o. male assessed today regarding refills of clinic administered specialty medication(s) Cabotegravir  & Rilpivirine  (CABENUVA )   Clinic requested Courier to Provider Office   Delivery date: 09/05/23   Verified address: [address] [place] [address]   Medication will be filled on 09/04/23.

## 2023-09-05 ENCOUNTER — Telehealth: Payer: Self-pay

## 2023-09-05 NOTE — Telephone Encounter (Signed)
 [name] Patient Advocate Encounter  [name] medications Cabenuva  have been couriered to [name] from [facility] and will be administered at the patients appointment on 09/12/23.  Jose Olsen, CPhT Specialty Pharmacy Patient [facility] for Infectious Disease Phone: [phone] Fax:  [phone]

## 2023-09-12 ENCOUNTER — Other Ambulatory Visit (HOSPITAL_COMMUNITY)
Admission: RE | Admit: 2023-09-12 | Discharge: 2023-09-12 | Disposition: A | Discharge status: Home / Self Care | Source: Ambulatory Visit | Attending: Internal Medicine | Admitting: Internal Medicine

## 2023-09-12 ENCOUNTER — Other Ambulatory Visit: Payer: Self-pay

## 2023-09-12 ENCOUNTER — Ambulatory Visit: Admitting: Internal Medicine

## 2023-09-12 ENCOUNTER — Encounter: Payer: Self-pay | Admitting: Internal Medicine

## 2023-09-12 VITALS — BP 115/78 | HR 88 | Temp 97.9°F | Ht 70.0 in | Wt 199.0 lb

## 2023-09-12 DIAGNOSIS — B20 Human immunodeficiency virus [HIV] disease: Secondary | ICD-10-CM | POA: Diagnosis present

## 2023-09-12 MED ORDER — CABOTEGRAVIR & RILPIVIRINE ER 400 & 600 MG/2ML IM SUER
1.0000 | Freq: Once | INTRAMUSCULAR | Status: AC
  Administered 2023-09-12: 1 via INTRAMUSCULAR

## 2023-09-12 NOTE — Progress Notes (Signed)
 [facility]     HPI: Jose Olsen is a 38 y.o. male presents for HIV management on cab,  Date of diagnosis About age [age] ART exposure atripla->genvoya -. Cab Past Ois no Risk factors: MSM Partners in last 2months 1 in the last 12 months 1. Same partner x 5 years Anal sex verse. Contraception sometimes   Social: Occupation: Museum/gallery conservator: house, by himself Support: partner, family Understanding of HIV: Etoh/drug/tobacco use: Socail/mj/no  Past Medical History:  Diagnosis Date   Chicken pox    HIV infection (HCC)     No past surgical history on file.  Family History  Problem Relation Age of Onset   Diabetes Paternal Uncle    Diabetes Maternal Grandmother    Hypertension Maternal Grandmother    Stroke Maternal Grandmother    Cancer Other        prostate   Current Outpatient Medications on File Prior to Visit  Medication Sig Dispense Refill   cabotegravir  & rilpivirine  ER (CABENUVA ) 400 & 600 MG/2ML injection Inject 1 kit into the muscle every 30 (thirty) days. 4 mL 11   cyclobenzaprine  (FLEXERIL ) 10 MG tablet Take 1 tablet (10 mg total) by mouth 2 (two) times daily as needed for muscle spasms. 10 tablet 0   naproxen  (NAPROSYN ) 500 MG tablet Take 1 tablet (500 mg total) by mouth 2 (two) times daily as needed. 14 tablet 0   terbinafine  (LAMISIL ) 250 MG tablet Take 1 tablet (250 mg total) by mouth daily. (Patient not taking: Reported on 09/12/2023) 28 tablet 1   No current facility-administered medications on file prior to visit.    No Known Allergies    Lab Results HIV 1 RNA Quant (Copies/mL)  Date Value  04/18/2023 Not Detected  10/19/2022 Not Detected  06/07/2022 Not Detected   CD4 T Cell Abs (/uL)  Date Value  10/19/2022 815  08/12/2021 753  12/16/2020 655   No results found for: HIV1GENOSEQ Lab Results  Component Value Date   WBC 4.6 10/19/2022   HGB 14.0 10/19/2022   HCT 40.9 10/19/2022   MCV 94.7  10/19/2022   PLT 259 10/19/2022    Lab Results  Component Value Date   CREATININE 1.10 04/18/2023   BUN 17 04/18/2023   NA 142 04/18/2023   K 4.1 04/18/2023   CL 106 04/18/2023   CO2 28 04/18/2023   Lab Results  Component Value Date   ALT 17 04/18/2023   AST 13 04/18/2023   ALKPHOS 55 03/28/2016   BILITOT 0.4 04/18/2023    Lab Results  Component Value Date   CHOL 175 10/19/2022   TRIG 187 (H) 10/19/2022   HDL 34 (L) 10/19/2022   LDLCALC 111 (H) 10/19/2022   Lab Results  Component Value Date   HAV REACTIVE (A) 12/16/2020   Lab Results  Component Value Date   HEPBSAG NEGATIVE 11/11/2010   HEPBSAB REACTIVE (A) 12/16/2020   Lab Results  Component Value Date   HCVAB NEGATIVE 11/11/2010   Lab Results  Component Value Date   CHLAMYDIAWP Negative 10/19/2022   CHLAMYDIAWP Negative 10/19/2022   CHLAMYDIAWP Negative 10/19/2022   N Negative 10/19/2022   N Negative 10/19/2022   N Negative 10/19/2022   Lab Results  Component Value Date   GCPROBEAPT POSITIVE (A) 10/10/2013   No results found for: QUANTGOLD  Assessment/Plan #HIV -CD4 815, VL nd, on cab on 03/29/23 -cab today -HIV labs today -f/u in  6 moths, f/u witih pharmacy  for inj in the interim  #Vaccination COVID Flu Monkeypox PCV 20 on 06/07/21 Meningitis HepA immunized, immune HEpB immunized, immune Tdap 07/12/21 Shingles  #Health maintenance -Quantiferon today -RPR 10/19/22 -HCV today -GC urine today -Lipid today -Dysplasia screen M today -Colonoscopy:-> age dependent     Jose Stank, MD [facility] I have personally spent 48 minutes involved in face-to-face and non-face-to-face activities for this patient on the day of the visit. Professional time spent includes the following activities: Preparing to see the patient (review of tests), Obtaining and/or reviewing separately obtained history (admission/discharge record), Performing a  medically appropriate examination and/or evaluation , Ordering medications/tests/procedures, referring and communicating with other health care professionals, Documenting clinical information in the EMR, Independently interpreting results (not separately reported), Communicating results to the patient/family/caregiver, Counseling and educating the patient/family/caregiver and Care coordination (not separately reported).

## 2023-09-13 LAB — URINE CYTOLOGY ANCILLARY ONLY
Chlamydia: NEGATIVE
Comment: NEGATIVE
Comment: NORMAL
Neisseria Gonorrhea: NEGATIVE

## 2023-09-13 LAB — T-HELPER CELLS (CD4) COUNT (NOT AT ARMC)
CD4 % Helper T Cell: 44 % (ref 33–65)
CD4 T Cell Abs: 794 /uL (ref 400–1790)

## 2023-09-14 LAB — RPR: RPR Ser Ql: NONREACTIVE

## 2023-09-14 LAB — COMPLETE METABOLIC PANEL WITHOUT GFR
AG Ratio: 2 (calc) (ref 1.0–2.5)
ALT: 14 U/L (ref 9–46)
AST: 14 U/L (ref 10–40)
Albumin: 4.6 g/dL (ref 3.6–5.1)
Alkaline phosphatase (APISO): 60 U/L (ref 36–130)
BUN: 13 mg/dL (ref 7–25)
CO2: 28 mmol/L (ref 20–32)
Calcium: 9.7 mg/dL (ref 8.6–10.3)
Chloride: 106 mmol/L (ref 98–110)
Creat: 0.99 mg/dL (ref 0.60–1.26)
Globulin: 2.3 g/dL (ref 1.9–3.7)
Glucose, Bld: 93 mg/dL (ref 65–99)
Potassium: 4.2 mmol/L (ref 3.5–5.3)
Sodium: 140 mmol/L (ref 135–146)
Total Bilirubin: 0.6 mg/dL (ref 0.2–1.2)
Total Protein: 6.9 g/dL (ref 6.1–8.1)

## 2023-09-14 LAB — CBC WITH DIFFERENTIAL/PLATELET
Absolute Lymphocytes: 2088 {cells}/uL (ref 850–3900)
Absolute Monocytes: 342 {cells}/uL (ref 200–950)
Basophils Absolute: 32 {cells}/uL (ref 0–200)
Basophils Relative: 0.7 %
Eosinophils Absolute: 99 {cells}/uL (ref 15–500)
Eosinophils Relative: 2.2 %
HCT: 43.3 % (ref 38.5–50.0)
Hemoglobin: 14 g/dL (ref 13.2–17.1)
MCH: 32.3 pg (ref 27.0–33.0)
MCHC: 32.3 g/dL (ref 32.0–36.0)
MCV: 99.8 fL (ref 80.0–100.0)
MPV: 9.8 fL (ref 7.5–12.5)
Monocytes Relative: 7.6 %
Neutro Abs: 1940 {cells}/uL (ref 1500–7800)
Neutrophils Relative %: 43.1 %
Platelets: 271 10*3/uL (ref 140–400)
RBC: 4.34 10*6/uL (ref 4.20–5.80)
RDW: 12 % (ref 11.0–15.0)
Total Lymphocyte: 46.4 %
WBC: 4.5 10*3/uL (ref 3.8–10.8)

## 2023-09-14 LAB — QUANTIFERON-TB GOLD PLUS
Mitogen-NIL: >10 [IU]/mL
NIL: 0.05 [IU]/mL
QuantiFERON-TB Gold Plus: NEGATIVE
TB1-NIL: 0.01 [IU]/mL
TB2-NIL: 0 [IU]/mL

## 2023-09-14 LAB — LIPID PANEL
Cholesterol: 206 mg/dL — ABNORMAL HIGH (ref <200)
HDL: 45 mg/dL (ref >=40)
LDL Cholesterol (Calc): 133 mg/dL — ABNORMAL HIGH
Non-HDL Cholesterol (Calc): 161 mg/dL — ABNORMAL HIGH (ref <130)
Total CHOL/HDL Ratio: 4.6 (calc) (ref <5.0)
Triglycerides: 149 mg/dL (ref <150)

## 2023-09-14 LAB — HIV-1 RNA QUANT-NO REFLEX-BLD
HIV 1 RNA Quant: NOT DETECTED {copies}/mL
HIV-1 RNA Quant, Log: NOT DETECTED {Log_copies}/mL

## 2023-09-14 LAB — HEPATITIS C ANTIBODY: Hepatitis C Ab: NONREACTIVE

## 2023-09-18 LAB — CYTOLOGY - PAP: Diagnosis: NEGATIVE

## 2023-10-02 ENCOUNTER — Other Ambulatory Visit (HOSPITAL_COMMUNITY): Payer: Self-pay

## 2023-10-02 ENCOUNTER — Other Ambulatory Visit: Payer: Self-pay

## 2023-10-02 NOTE — Progress Notes (Signed)
 Specialty Pharmacy Refill Coordination Note  Jose Olsen is a 38 y.o. male assessed today regarding refills of clinic administered specialty medication(s) Cabotegravir  & Rilpivirine  (CABENUVA )   Clinic requested Courier to Provider Office   Delivery date: 10/09/23   Verified address: [address] [place] [address]   Medication will be filled on 10/06/23.

## 2023-10-06 ENCOUNTER — Other Ambulatory Visit: Payer: Self-pay

## 2023-10-09 ENCOUNTER — Telehealth: Payer: Self-pay

## 2023-10-09 NOTE — Telephone Encounter (Signed)
 [name] Patient Advocate Encounter  [name] medications Cabenuva  have been couriered to [name] from [facility] and will be administered at the patients appointment on 10/18/23.  Arland Hutchinson, CPhT Specialty Pharmacy Patient [facility] for Infectious Disease Phone: [phone] Fax:  [phone]

## 2023-10-17 NOTE — Progress Notes (Deleted)
 HPI: Jose Olsen is a 39 y.o. male who presents to the [name] pharmacy clinic for Cabenuva  administration.  Patient Active Problem List   Diagnosis Date Noted   Injection education, encounter for 10/13/2020   Healthcare maintenance 09/15/2020   Screening for STDs (sexually transmitted diseases) 06/16/2020   Medication monitoring encounter 11/04/2019   HTN (hypertension) 05/07/2019   Screening examination for venereal disease 03/28/2016   Encounter for long-term (current) use of high-risk medication 03/28/2016   Human immunodeficiency virus (HIV) disease (HCC) 12/02/2010   ONYCHOMYCOSIS 05/19/2009    [name] Medications  New Prescriptions   No medications on file  Previous Medications   CABOTEGRAVIR  & RILPIVIRINE  ER (CABENUVA ) 400 & 600 MG/2ML INJECTION    Inject 1 kit into the muscle every 30 (thirty) days.   CYCLOBENZAPRINE  (FLEXERIL ) 10 MG TABLET    Take 1 tablet (10 mg total) by mouth 2 (two) times daily as needed for muscle spasms.   NAPROXEN  (NAPROSYN ) 500 MG TABLET    Take 1 tablet (500 mg total) by mouth 2 (two) times daily as needed.   TERBINAFINE  (LAMISIL ) 250 MG TABLET    Take 1 tablet (250 mg total) by mouth daily.  Modified Medications   No medications on file  Discontinued Medications   No medications on file    Allergies: No Known Allergies  Labs: Lab Results  Component Value Date   HIV1RNAQUANT NOT DETECTED 09/12/2023   HIV1RNAQUANT Not Detected 04/18/2023   HIV1RNAQUANT Not Detected 10/19/2022   CD4TABS 794 09/12/2023   CD4TABS 815 10/19/2022   CD4TABS 753 08/12/2021    RPR and STI Lab Results  Component Value Date   LABRPR NON-REACTIVE 09/12/2023   LABRPR NON-REACTIVE 10/19/2022   LABRPR NON-REACTIVE 08/12/2021   LABRPR NON-REACTIVE 12/16/2020   LABRPR NON-REACTIVE 06/16/2020    STI Results GC GC CT CT  Latest Ref Rng & Units  NEGATIVE  NEGATIVE  09/12/2023 10:08 AM Negative   Negative    10/19/2022  2:01 PM Negative    Negative     Negative   Negative    Negative    Negative    08/12/2021 10:14 AM Negative    Negative    Negative   Negative    Negative    Negative    02/15/2021  9:25 AM Negative   Negative    06/16/2020 10:36 AM Negative    Negative    Negative   Negative    Negative    Negative    05/07/2019  9:59 AM Negative    Negative    Negative   Negative    Negative    Negative    08/14/2018 12:00 AM Negative   Negative    10/10/2013  2:34 AM  POSITIVE   NEGATIVE     Hepatitis B Lab Results  Component Value Date   HEPBSAB REACTIVE (A) 12/16/2020   HEPBSAG NEGATIVE 11/11/2010   Hepatitis C Lab Results  Component Value Date   HEPCAB NON-REACTIVE 09/12/2023   Hepatitis A Lab Results  Component Value Date   HAV REACTIVE (A) 12/16/2020   Lipids: Lab Results  Component Value Date   CHOL 206 (H) 09/12/2023   TRIG 149 09/12/2023   HDL 45 09/12/2023   CHOLHDL 4.6 09/12/2023   VLDL 19 12/14/2012   LDLCALC 133 (H) 09/12/2023    TARGET DATE: Th 26th  Assessment: Jose Olsen presents today for his maintenance Cabenuva  injections. Past injections were tolerated well without issues. Last HIV RNA was not detected  in May. Doing well with no issues today.  Administered cabotegravir  600mg /40mL in left upper outer quadrant of the gluteal muscle. Administered rilpivirine  900 mg/3mL in the right upper outer quadrant of the gluteal muscle. No issues with injections. He will follow up in 2 months for next set of injections.  Plan: - Cabenuva  injections administered - Next injections scheduled for *** - Call with any issues or questions  Anjalee Cope L. Unity Luepke, PharmD, BCIDP, AAHIVP, CPP Clinical Pharmacist Practitioner - Infectious Diseases Clinical Pharmacist Lead - Specialty Pharmacy [facility] for Infectious Disease

## 2023-10-18 ENCOUNTER — Ambulatory Visit: Admitting: Pharmacist

## 2023-10-19 ENCOUNTER — Ambulatory Visit: Admitting: Internal Medicine

## 2023-10-19 NOTE — Progress Notes (Deleted)
 Presents for cab inj. Missed appt with pharmacy yesterday/ As previously noted f.u with pharmacy for inj q2 months and myself for HIV maintenance every 6 months.

## 2023-10-23 ENCOUNTER — Ambulatory Visit: Admitting: Pharmacist

## 2023-10-23 ENCOUNTER — Other Ambulatory Visit: Payer: Self-pay

## 2023-10-23 DIAGNOSIS — B20 Human immunodeficiency virus [HIV] disease: Secondary | ICD-10-CM | POA: Diagnosis not present

## 2023-10-23 MED ORDER — CABOTEGRAVIR & RILPIVIRINE ER 400 & 600 MG/2ML IM SUER
1.0000 | Freq: Once | INTRAMUSCULAR | Status: AC
  Administered 2023-10-23: 1 via INTRAMUSCULAR

## 2023-10-23 NOTE — Progress Notes (Signed)
 HPI: Jose Olsen is a 38 y.o. male who presents to the [name] pharmacy clinic for Cabenuva  administration.  Patient Active Problem List   Diagnosis Date Noted   Injection education, encounter for 10/13/2020   Healthcare maintenance 09/15/2020   Screening for STDs (sexually transmitted diseases) 06/16/2020   Medication monitoring encounter 11/04/2019   HTN (hypertension) 05/07/2019   Screening examination for venereal disease 03/28/2016   Encounter for long-term (current) use of high-risk medication 03/28/2016   Human immunodeficiency virus (HIV) disease (HCC) 12/02/2010   ONYCHOMYCOSIS 05/19/2009    [name] Medications  New Prescriptions   No medications on file  Previous Medications   CABOTEGRAVIR  & RILPIVIRINE  ER (CABENUVA ) 400 & 600 MG/2ML INJECTION    Inject 1 kit into the muscle every 30 (thirty) days.   CYCLOBENZAPRINE  (FLEXERIL ) 10 MG TABLET    Take 1 tablet (10 mg total) by mouth 2 (two) times daily as needed for muscle spasms.   NAPROXEN  (NAPROSYN ) 500 MG TABLET    Take 1 tablet (500 mg total) by mouth 2 (two) times daily as needed.   TERBINAFINE  (LAMISIL ) 250 MG TABLET    Take 1 tablet (250 mg total) by mouth daily.  Modified Medications   No medications on file  Discontinued Medications   No medications on file    Allergies: No Known Allergies  Labs: Lab Results  Component Value Date   HIV1RNAQUANT NOT DETECTED 09/12/2023   HIV1RNAQUANT Not Detected 04/18/2023   HIV1RNAQUANT Not Detected 10/19/2022   CD4TABS 794 09/12/2023   CD4TABS 815 10/19/2022   CD4TABS 753 08/12/2021    RPR and STI Lab Results  Component Value Date   LABRPR NON-REACTIVE 09/12/2023   LABRPR NON-REACTIVE 10/19/2022   LABRPR NON-REACTIVE 08/12/2021   LABRPR NON-REACTIVE 12/16/2020   LABRPR NON-REACTIVE 06/16/2020    STI Results GC GC CT CT  Latest Ref Rng & Units  NEGATIVE  NEGATIVE  09/12/2023 10:08 AM Negative   Negative    10/19/2022  2:01 PM Negative    Negative     Negative   Negative    Negative    Negative    08/12/2021 10:14 AM Negative    Negative    Negative   Negative    Negative    Negative    02/15/2021  9:25 AM Negative   Negative    06/16/2020 10:36 AM Negative    Negative    Negative   Negative    Negative    Negative    05/07/2019  9:59 AM Negative    Negative    Negative   Negative    Negative    Negative    08/14/2018 12:00 AM Negative   Negative    10/10/2013  2:34 AM  POSITIVE   NEGATIVE     Hepatitis B Lab Results  Component Value Date   HEPBSAB REACTIVE (A) 12/16/2020   HEPBSAG NEGATIVE 11/11/2010   Hepatitis C Lab Results  Component Value Date   HEPCAB NON-REACTIVE 09/12/2023   Hepatitis A Lab Results  Component Value Date   HAV REACTIVE (A) 12/16/2020   Lipids: Lab Results  Component Value Date   CHOL 206 (H) 09/12/2023   TRIG 149 09/12/2023   HDL 45 09/12/2023   CHOLHDL 4.6 09/12/2023   VLDL 19 12/14/2012   LDLCALC 133 (H) 09/12/2023    TARGET DATE: Th 26th  Assessment: Jose Olsen presents today for his monthly maintenance Cabenuva  injections. Past injections were tolerated well without issues. Last HIV RNA was not  detected in June. He is 2 days out of his window this month but will continue on schedule since it is <2 months since his last injection (09/12/23).  Administered cabotegravir  400mg /38mL in left upper outer quadrant of the gluteal muscle. Administered rilpivirine  600 mg/2mL in the right upper outer quadrant of the gluteal muscle. No issues with injections. He will follow up in 1 month for next set of injections.   Plan: - Cabenuva  injections administered - Next injections scheduled for 11/14/23 with me - Call with any issues or questions  Estil Vallee L. Leticia Mcdiarmid, PharmD, BCIDP, AAHIVP, CPP Clinical Pharmacist Practitioner - Infectious Diseases Clinical Pharmacist Lead - Specialty Pharmacy [facility] for Infectious Disease

## 2023-11-06 ENCOUNTER — Other Ambulatory Visit (HOSPITAL_COMMUNITY): Payer: Self-pay

## 2023-11-06 ENCOUNTER — Other Ambulatory Visit: Payer: Self-pay

## 2023-11-06 NOTE — Progress Notes (Signed)
 Specialty Pharmacy Refill Coordination Note  Jose Olsen is a 38 y.o. male assessed today regarding refills of clinic administered specialty medication(s) Cabotegravir  & Rilpivirine  (CABENUVA )   Clinic requested Courier to Provider Office   Delivery date: 11/09/23   Verified address: [address] [place] [address]   Medication will be filled on 11/08/23.

## 2023-11-09 ENCOUNTER — Telehealth: Payer: Self-pay

## 2023-11-09 NOTE — Telephone Encounter (Signed)
 [name] Patient Advocate Encounter  [name] medications CABENUVA  have been couriered to [name] from [facility] and will be administered at the patients appointment on 11/14/23.  Charmaine Sharps, CPhT Specialty Pharmacy Patient [facility] for Infectious Disease Phone: [phone] Fax:  [phone]

## 2023-11-12 NOTE — Progress Notes (Unsigned)
 HPI: Jose Olsen is a 38 y.o. male who presents to the [name] pharmacy clinic for Cabenuva  administration.  Patient Active Problem List   Diagnosis Date Noted   Injection education, encounter for 10/13/2020   Healthcare maintenance 09/15/2020   Screening for STDs (sexually transmitted diseases) 06/16/2020   Medication monitoring encounter 11/04/2019   HTN (hypertension) 05/07/2019   Screening examination for venereal disease 03/28/2016   Encounter for long-term (current) use of high-risk medication 03/28/2016   Human immunodeficiency virus (HIV) disease (HCC) 12/02/2010   ONYCHOMYCOSIS 05/19/2009    [name] Medications  New Prescriptions   No medications on file  Previous Medications   CABOTEGRAVIR  & RILPIVIRINE  ER (CABENUVA ) 400 & 600 MG/2ML INJECTION    Inject 1 kit into the muscle every 30 (thirty) days.   CYCLOBENZAPRINE  (FLEXERIL ) 10 MG TABLET    Take 1 tablet (10 mg total) by mouth 2 (two) times daily as needed for muscle spasms.   NAPROXEN  (NAPROSYN ) 500 MG TABLET    Take 1 tablet (500 mg total) by mouth 2 (two) times daily as needed.   TERBINAFINE  (LAMISIL ) 250 MG TABLET    Take 1 tablet (250 mg total) by mouth daily.  Modified Medications   No medications on file  Discontinued Medications   No medications on file    Allergies: No Known Allergies  Labs: Lab Results  Component Value Date   HIV1RNAQUANT NOT DETECTED 09/12/2023   HIV1RNAQUANT Not Detected 04/18/2023   HIV1RNAQUANT Not Detected 10/19/2022   CD4TABS 794 09/12/2023   CD4TABS 815 10/19/2022   CD4TABS 753 08/12/2021    RPR and STI Lab Results  Component Value Date   LABRPR NON-REACTIVE 09/12/2023   LABRPR NON-REACTIVE 10/19/2022   LABRPR NON-REACTIVE 08/12/2021   LABRPR NON-REACTIVE 12/16/2020   LABRPR NON-REACTIVE 06/16/2020    STI Results GC GC CT CT  Latest Ref Rng & Units  NEGATIVE  NEGATIVE  09/12/2023 10:08 AM Negative   Negative    10/19/2022  2:01 PM Negative    Negative     Negative   Negative    Negative    Negative    08/12/2021 10:14 AM Negative    Negative    Negative   Negative    Negative    Negative    02/15/2021  9:25 AM Negative   Negative    06/16/2020 10:36 AM Negative    Negative    Negative   Negative    Negative    Negative    05/07/2019  9:59 AM Negative    Negative    Negative   Negative    Negative    Negative    08/14/2018 12:00 AM Negative   Negative    10/10/2013  2:34 AM  POSITIVE   NEGATIVE     Hepatitis B Lab Results  Component Value Date   HEPBSAB REACTIVE (A) 12/16/2020   HEPBSAG NEGATIVE 11/11/2010   Hepatitis C Lab Results  Component Value Date   HEPCAB NON-REACTIVE 09/12/2023   Hepatitis A Lab Results  Component Value Date   HAV REACTIVE (A) 12/16/2020   Lipids: Lab Results  Component Value Date   CHOL 206 (H) 09/12/2023   TRIG 149 09/12/2023   HDL 45 09/12/2023   CHOLHDL 4.6 09/12/2023   VLDL 19 12/14/2012   LDLCALC 133 (H) 09/12/2023    TARGET DATE: 26  Assessment: Jose Olsen presents today for his maintenance Cabenuva  injections. Past injections were tolerated well without issues. Last HIV RNA was undetectable in 09/12/23.  Doing well with no issues today.  Administered cabotegravir  600mg /71mL in left upper outer quadrant of the gluteal muscle. Administered rilpivirine  900 mg/3mL in the right upper outer quadrant of the gluteal muscle. No issues with injections. Jose Olsen will follow up in 2 months for next set of injections.  Plan: - Cabenuva  injections administered - Next injections scheduled for 12/11/23 - Call with any issues or questions  Jose Olsen, PharmD PGY1 Clinical Pharmacist [facility]  11/12/2023 2:36 PM

## 2023-11-14 ENCOUNTER — Ambulatory Visit (INDEPENDENT_AMBULATORY_CARE_PROVIDER_SITE_OTHER): Admitting: Pharmacist

## 2023-11-14 ENCOUNTER — Other Ambulatory Visit: Payer: Self-pay

## 2023-11-14 DIAGNOSIS — B20 Human immunodeficiency virus [HIV] disease: Secondary | ICD-10-CM | POA: Diagnosis not present

## 2023-11-14 DIAGNOSIS — Z113 Encounter for screening for infections with a predominantly sexual mode of transmission: Secondary | ICD-10-CM

## 2023-11-14 MED ORDER — CABOTEGRAVIR & RILPIVIRINE ER 400 & 600 MG/2ML IM SUER
1.0000 | Freq: Once | INTRAMUSCULAR | Status: AC
  Administered 2023-11-14: 1 via INTRAMUSCULAR

## 2023-12-04 ENCOUNTER — Other Ambulatory Visit: Payer: Self-pay

## 2023-12-04 ENCOUNTER — Other Ambulatory Visit (HOSPITAL_COMMUNITY): Payer: Self-pay

## 2023-12-04 NOTE — Progress Notes (Signed)
 Specialty Pharmacy Refill Coordination Note  Jose Olsen is a 38 y.o. male assessed today regarding refills of clinic administered specialty medication(s) Cabotegravir  & Rilpivirine  (CABENUVA )   Clinic requested Courier to Provider Office   Delivery date: 12/07/23   Verified address: [address] [place] [address]   Medication will be filled on 12/06/23.

## 2023-12-06 ENCOUNTER — Other Ambulatory Visit: Payer: Self-pay

## 2023-12-07 ENCOUNTER — Telehealth: Payer: Self-pay

## 2023-12-07 NOTE — Telephone Encounter (Signed)
 [name] Patient Advocate Encounter  [name] medications CABENUVA  have been couriered to [name] from [facility] and will be administered at the patients appointment on 12/11/23.  Charmaine Sharps, CPhT Specialty Pharmacy Patient [facility] for Infectious Disease Phone: [phone] Fax:  [phone]

## 2023-12-10 NOTE — Progress Notes (Unsigned)
 HPI: Jose Olsen is a 38 y.o. male who presents to the [name] pharmacy clinic for Cabenuva  administration.  Patient Active Problem List   Diagnosis Date Noted   Injection education, encounter for 10/13/2020   Healthcare maintenance 09/15/2020   Screening for STDs (sexually transmitted diseases) 06/16/2020   Medication monitoring encounter 11/04/2019   HTN (hypertension) 05/07/2019   Screening examination for venereal disease 03/28/2016   Encounter for long-term (current) use of high-risk medication 03/28/2016   Human immunodeficiency virus (HIV) disease (HCC) 12/02/2010   ONYCHOMYCOSIS 05/19/2009    [name] Medications  New Prescriptions   No medications on file  Previous Medications   CABOTEGRAVIR  & RILPIVIRINE  ER (CABENUVA ) 400 & 600 MG/2ML INJECTION    Inject 1 kit into the muscle every 30 (thirty) days.   CYCLOBENZAPRINE  (FLEXERIL ) 10 MG TABLET    Take 1 tablet (10 mg total) by mouth 2 (two) times daily as needed for muscle spasms.   NAPROXEN  (NAPROSYN ) 500 MG TABLET    Take 1 tablet (500 mg total) by mouth 2 (two) times daily as needed.   TERBINAFINE  (LAMISIL ) 250 MG TABLET    Take 1 tablet (250 mg total) by mouth daily.  Modified Medications   No medications on file  Discontinued Medications   No medications on file    Allergies: No Known Allergies  Labs: Lab Results  Component Value Date   HIV1RNAQUANT NOT DETECTED 09/12/2023   HIV1RNAQUANT Not Detected 04/18/2023   HIV1RNAQUANT Not Detected 10/19/2022   CD4TABS 794 09/12/2023   CD4TABS 815 10/19/2022   CD4TABS 753 08/12/2021    RPR and STI Lab Results  Component Value Date   LABRPR NON-REACTIVE 09/12/2023   LABRPR NON-REACTIVE 10/19/2022   LABRPR NON-REACTIVE 08/12/2021   LABRPR NON-REACTIVE 12/16/2020   LABRPR NON-REACTIVE 06/16/2020    STI Results GC GC CT CT  Latest Ref Rng & Units  NEGATIVE  NEGATIVE  09/12/2023 10:08 AM Negative   Negative    10/19/2022  2:01 PM Negative    Negative     Negative   Negative    Negative    Negative    08/12/2021 10:14 AM Negative    Negative    Negative   Negative    Negative    Negative    02/15/2021  9:25 AM Negative   Negative    06/16/2020 10:36 AM Negative    Negative    Negative   Negative    Negative    Negative    05/07/2019  9:59 AM Negative    Negative    Negative   Negative    Negative    Negative    08/14/2018 12:00 AM Negative   Negative    10/10/2013  2:34 AM  POSITIVE   NEGATIVE     Hepatitis B Lab Results  Component Value Date   HEPBSAB REACTIVE (A) 12/16/2020   HEPBSAG NEGATIVE 11/11/2010   Hepatitis C Lab Results  Component Value Date   HEPCAB NON-REACTIVE 09/12/2023   Hepatitis A Lab Results  Component Value Date   HAV REACTIVE (A) 12/16/2020   Lipids: Lab Results  Component Value Date   CHOL 206 (H) 09/12/2023   TRIG 149 09/12/2023   HDL 45 09/12/2023   CHOLHDL 4.6 09/12/2023   VLDL 19 12/14/2012   LDLCALC 133 (H) 09/12/2023    TARGET DATE: The 26th  Assessment: Tedford presents today for his maintenance Cabenuva  injections. Past injections were tolerated well without issues. Last HIV RNA was undetectable in  June. Doing well with no issues today.  Administered cabotegravir  600mg /34mL in left upper outer quadrant of the gluteal muscle. Administered rilpivirine  900 mg/3mL in the right upper outer quadrant of the gluteal muscle. No issues with injections. He will follow up in 2 months for next set of injections.  Discussed eligibility for the 2025-2026 flu and meningitis booster vaccines. He ______ today.  Past injections go well? Don't think needs a VL since been undetectable for so long Vax: flu, meningitis booster Scheduled for 10/20, 11/25, 12/22 with [name] Give Cab  Plan: - Cabenuva  injections administered - Next injections scheduled for 01/08/24, 02/13/24 and 03/11/24 with [name] - Call with any issues or questions  Izetta Carl, PharmD PGY1 Pharmacy Resident

## 2023-12-11 ENCOUNTER — Ambulatory Visit: Payer: Self-pay | Admitting: Pharmacist

## 2023-12-11 DIAGNOSIS — Z23 Encounter for immunization: Secondary | ICD-10-CM

## 2023-12-11 NOTE — Progress Notes (Unsigned)
 HPI: Jose Olsen is a 38 y.o. male who presents to the [name] pharmacy clinic for Cabenuva  administration.  Patient Active Problem List   Diagnosis Date Noted   Injection education, encounter for 10/13/2020   Healthcare maintenance 09/15/2020   Screening for STDs (sexually transmitted diseases) 06/16/2020   Medication monitoring encounter 11/04/2019   HTN (hypertension) 05/07/2019   Screening examination for venereal disease 03/28/2016   Encounter for long-term (current) use of high-risk medication 03/28/2016   Human immunodeficiency virus (HIV) disease (HCC) 12/02/2010   ONYCHOMYCOSIS 05/19/2009    [name] Medications  New Prescriptions   No medications on file  Previous Medications   CABOTEGRAVIR  & RILPIVIRINE  ER (CABENUVA ) 400 & 600 MG/2ML INJECTION    Inject 1 kit into the muscle every 30 (thirty) days.   CYCLOBENZAPRINE  (FLEXERIL ) 10 MG TABLET    Take 1 tablet (10 mg total) by mouth 2 (two) times daily as needed for muscle spasms.   NAPROXEN  (NAPROSYN ) 500 MG TABLET    Take 1 tablet (500 mg total) by mouth 2 (two) times daily as needed.   TERBINAFINE  (LAMISIL ) 250 MG TABLET    Take 1 tablet (250 mg total) by mouth daily.  Modified Medications   No medications on file  Discontinued Medications   No medications on file    Allergies: No Known Allergies  Labs: Lab Results  Component Value Date   HIV1RNAQUANT NOT DETECTED 09/12/2023   HIV1RNAQUANT Not Detected 04/18/2023   HIV1RNAQUANT Not Detected 10/19/2022   CD4TABS 794 09/12/2023   CD4TABS 815 10/19/2022   CD4TABS 753 08/12/2021    RPR and STI Lab Results  Component Value Date   LABRPR NON-REACTIVE 09/12/2023   LABRPR NON-REACTIVE 10/19/2022   LABRPR NON-REACTIVE 08/12/2021   LABRPR NON-REACTIVE 12/16/2020   LABRPR NON-REACTIVE 06/16/2020    STI Results GC GC CT CT  Latest Ref Rng & Units  NEGATIVE  NEGATIVE  09/12/2023 10:08 AM Negative   Negative    10/19/2022  2:01 PM Negative    Negative     Negative   Negative    Negative    Negative    08/12/2021 10:14 AM Negative    Negative    Negative   Negative    Negative    Negative    02/15/2021  9:25 AM Negative   Negative    06/16/2020 10:36 AM Negative    Negative    Negative   Negative    Negative    Negative    05/07/2019  9:59 AM Negative    Negative    Negative   Negative    Negative    Negative    08/14/2018 12:00 AM Negative   Negative    10/10/2013  2:34 AM  POSITIVE   NEGATIVE     Hepatitis B Lab Results  Component Value Date   HEPBSAB REACTIVE (A) 12/16/2020   HEPBSAG NEGATIVE 11/11/2010   Hepatitis C Lab Results  Component Value Date   HEPCAB NON-REACTIVE 09/12/2023   Hepatitis A Lab Results  Component Value Date   HAV REACTIVE (A) 12/16/2020   Lipids: Lab Results  Component Value Date   CHOL 206 (H) 09/12/2023   TRIG 149 09/12/2023   HDL 45 09/12/2023   CHOLHDL 4.6 09/12/2023   VLDL 19 12/14/2012   LDLCALC 133 (H) 09/12/2023    TARGET DATE: 26  Assessment: Jose Olsen presents today for his maintenance Cabenuva  injections. Past injections were tolerated well without issues. Last HIV RNA was not detected on  09/12/2023. Doing well with no issues today. *** STI testing. Patient is eligible for the shingles and influenza vaccine. ***  Administered cabotegravir  600mg /38mL in left upper outer quadrant of the gluteal muscle. Administered rilpivirine  900 mg/3mL in the right upper outer quadrant of the gluteal muscle. No issues with injections. *** will follow up in 2 months for next set of injections.  Plan: - Cabenuva  injections administered - Check HIV RNA today  - *** STI testing - Next injections scheduled for 01/08/2024 with [name]  - Call with any issues or questions  Feliciano Close, PharmD PGY2 Infectious Diseases Pharmacy Resident  12/11/2023 5:07 PM

## 2023-12-12 ENCOUNTER — Other Ambulatory Visit: Payer: Self-pay

## 2023-12-12 ENCOUNTER — Ambulatory Visit (INDEPENDENT_AMBULATORY_CARE_PROVIDER_SITE_OTHER): Admitting: Pharmacist

## 2023-12-12 DIAGNOSIS — B20 Human immunodeficiency virus [HIV] disease: Secondary | ICD-10-CM | POA: Diagnosis not present

## 2023-12-12 DIAGNOSIS — Z113 Encounter for screening for infections with a predominantly sexual mode of transmission: Secondary | ICD-10-CM

## 2023-12-12 MED ORDER — CABOTEGRAVIR & RILPIVIRINE ER 400 & 600 MG/2ML IM SUER
1.0000 | Freq: Once | INTRAMUSCULAR | Status: AC
  Administered 2023-12-12: 1 via INTRAMUSCULAR

## 2023-12-12 NOTE — Progress Notes (Signed)
 HPI: Jose Olsen is a 38 y.o. male who presents to the [name] pharmacy clinic for Cabenuva  administration.  Patient Active Problem List   Diagnosis Date Noted   Injection education, encounter for 10/13/2020   Healthcare maintenance 09/15/2020   Screening for STDs (sexually transmitted diseases) 06/16/2020   Medication monitoring encounter 11/04/2019   HTN (hypertension) 05/07/2019   Screening examination for venereal disease 03/28/2016   Encounter for long-term (current) use of high-risk medication 03/28/2016   Human immunodeficiency virus (HIV) disease (HCC) 12/02/2010   ONYCHOMYCOSIS 05/19/2009    [name] Medications  New Prescriptions   No medications on file  Previous Medications   CABOTEGRAVIR  & RILPIVIRINE  ER (CABENUVA ) 400 & 600 MG/2ML INJECTION    Inject 1 kit into the muscle every 30 (thirty) days.   CYCLOBENZAPRINE  (FLEXERIL ) 10 MG TABLET    Take 1 tablet (10 mg total) by mouth 2 (two) times daily as needed for muscle spasms.   NAPROXEN  (NAPROSYN ) 500 MG TABLET    Take 1 tablet (500 mg total) by mouth 2 (two) times daily as needed.   TERBINAFINE  (LAMISIL ) 250 MG TABLET    Take 1 tablet (250 mg total) by mouth daily.  Modified Medications   No medications on file  Discontinued Medications   No medications on file    Allergies: No Known Allergies  Labs: Lab Results  Component Value Date   HIV1RNAQUANT NOT DETECTED 09/12/2023   HIV1RNAQUANT Not Detected 04/18/2023   HIV1RNAQUANT Not Detected 10/19/2022   CD4TABS 794 09/12/2023   CD4TABS 815 10/19/2022   CD4TABS 753 08/12/2021    RPR and STI Lab Results  Component Value Date   LABRPR NON-REACTIVE 09/12/2023   LABRPR NON-REACTIVE 10/19/2022   LABRPR NON-REACTIVE 08/12/2021   LABRPR NON-REACTIVE 12/16/2020   LABRPR NON-REACTIVE 06/16/2020    STI Results GC GC CT CT  Latest Ref Rng & Units  NEGATIVE  NEGATIVE  09/12/2023 10:08 AM Negative   Negative    10/19/2022  2:01 PM Negative    Negative     Negative   Negative    Negative    Negative    08/12/2021 10:14 AM Negative    Negative    Negative   Negative    Negative    Negative    02/15/2021  9:25 AM Negative   Negative    06/16/2020 10:36 AM Negative    Negative    Negative   Negative    Negative    Negative    05/07/2019  9:59 AM Negative    Negative    Negative   Negative    Negative    Negative    08/14/2018 12:00 AM Negative   Negative    10/10/2013  2:34 AM  POSITIVE   NEGATIVE     Hepatitis B Lab Results  Component Value Date   HEPBSAB REACTIVE (A) 12/16/2020   HEPBSAG NEGATIVE 11/11/2010   Hepatitis C Lab Results  Component Value Date   HEPCAB NON-REACTIVE 09/12/2023   Hepatitis A Lab Results  Component Value Date   HAV REACTIVE (A) 12/16/2020   Lipids: Lab Results  Component Value Date   CHOL 206 (H) 09/12/2023   TRIG 149 09/12/2023   HDL 45 09/12/2023   CHOLHDL 4.6 09/12/2023   VLDL 19 12/14/2012   LDLCALC 133 (H) 09/12/2023    TARGET DATE: The 26th  Assessment: Jose Olsen presents today for his maintenance Cabenuva  injections. Past injections were tolerated well without issues. Last HIV RNA was undetectable in  June. Will defer HIV RNA today. Doing well with no issues today.  Administered cabotegravir  400mg /45mL in left upper outer quadrant of the gluteal muscle. Administered rilpivirine  600 mg/2mL in the right upper outer quadrant of the gluteal muscle. No issues with injections. Jose Olsen will follow up in 1 month for next set of injections.   Discussed eligibility for the updated flu vaccine. He politely declines today.   Plan: - Cabenuva  injections administered - Next injections scheduled for 01/08/24, 02/13/24 and 03/11/24 with [name] - Call with any issues or questions  Izetta Carl, PharmD PGY1 Pharmacy Resident

## 2023-12-29 ENCOUNTER — Other Ambulatory Visit: Payer: Self-pay

## 2023-12-29 ENCOUNTER — Other Ambulatory Visit (HOSPITAL_COMMUNITY): Payer: Self-pay

## 2023-12-29 NOTE — Progress Notes (Signed)
 Specialty Pharmacy Refill Coordination Note  Jose Olsen is a 38 y.o. male assessed today regarding refills of clinic administered specialty medication(s) Cabotegravir  & Rilpivirine  (CABENUVA )   Clinic requested Courier to Provider Office   Delivery date: 01/03/24   Verified address: [address] [place] [address]   Medication will be filled on 01/02/24.

## 2024-01-03 ENCOUNTER — Telehealth: Payer: Self-pay

## 2024-01-03 NOTE — Telephone Encounter (Signed)
 [name] Patient Advocate Encounter  [name] medications CABENUVA  have been couriered to [name] from [facility] and will be administered at the patients appointment on 01/08/24.  Charmaine Sharps, CPhT Specialty Pharmacy Patient [facility] Phone: [phone] Fax:  [phone]

## 2024-01-05 NOTE — Progress Notes (Unsigned)
 HPI: Jose Olsen is a 38 y.o. male who presents to the [name] pharmacy clinic for Cabenuva  administration.  Referring ID Provider: Dr. Dennise  Patient Active Problem List   Diagnosis Date Noted   Injection education, encounter for 10/13/2020   Healthcare maintenance 09/15/2020   Screening for STDs (sexually transmitted diseases) 06/16/2020   Medication monitoring encounter 11/04/2019   HTN (hypertension) 05/07/2019   Screening examination for venereal disease 03/28/2016   Encounter for long-term (current) use of high-risk medication 03/28/2016   Human immunodeficiency virus (HIV) disease (HCC) 12/02/2010   ONYCHOMYCOSIS 05/19/2009    [name] Medications  New Prescriptions   No medications on file  Previous Medications   CABOTEGRAVIR  & RILPIVIRINE  ER (CABENUVA ) 400 & 600 MG/2ML INJECTION    Inject 1 kit into the muscle every 30 (thirty) days.   CYCLOBENZAPRINE  (FLEXERIL ) 10 MG TABLET    Take 1 tablet (10 mg total) by mouth 2 (two) times daily as needed for muscle spasms.   NAPROXEN  (NAPROSYN ) 500 MG TABLET    Take 1 tablet (500 mg total) by mouth 2 (two) times daily as needed.   TERBINAFINE  (LAMISIL ) 250 MG TABLET    Take 1 tablet (250 mg total) by mouth daily.  Modified Medications   No medications on file  Discontinued Medications   No medications on file    Allergies: No Known Allergies  Labs: Lab Results  Component Value Date   HIV1RNAQUANT NOT DETECTED 09/12/2023   HIV1RNAQUANT Not Detected 04/18/2023   HIV1RNAQUANT Not Detected 10/19/2022   CD4TABS 794 09/12/2023   CD4TABS 815 10/19/2022   CD4TABS 753 08/12/2021    RPR and STI Lab Results  Component Value Date   LABRPR NON-REACTIVE 09/12/2023   LABRPR NON-REACTIVE 10/19/2022   LABRPR NON-REACTIVE 08/12/2021   LABRPR NON-REACTIVE 12/16/2020   LABRPR NON-REACTIVE 06/16/2020    STI Results GC GC CT CT  Latest Ref Rng & Units  NEGATIVE  NEGATIVE  09/12/2023 10:08 AM Negative   Negative    10/19/2022   2:01 PM Negative    Negative    Negative   Negative    Negative    Negative    08/12/2021 10:14 AM Negative    Negative    Negative   Negative    Negative    Negative    02/15/2021  9:25 AM Negative   Negative    06/16/2020 10:36 AM Negative    Negative    Negative   Negative    Negative    Negative    05/07/2019  9:59 AM Negative    Negative    Negative   Negative    Negative    Negative    08/14/2018 12:00 AM Negative   Negative    10/10/2013  2:34 AM  POSITIVE   NEGATIVE     Hepatitis B Lab Results  Component Value Date   HEPBSAB REACTIVE (A) 12/16/2020   HEPBSAG NEGATIVE 11/11/2010   Hepatitis C Lab Results  Component Value Date   HEPCAB NON-REACTIVE 09/12/2023   Hepatitis A Lab Results  Component Value Date   HAV REACTIVE (A) 12/16/2020   Lipids: Lab Results  Component Value Date   CHOL 206 (H) 09/12/2023   TRIG 149 09/12/2023   HDL 45 09/12/2023   CHOLHDL 4.6 09/12/2023   VLDL 19 12/14/2012   LDLCALC 133 (H) 09/12/2023    Target Date: The 26th  Assessment: Hatcher presents today for his maintenance Cabenuva  injections. Past injections were tolerated well without issues.  Last HIV RNA was not detected in June. Doing well with no issues today.  Lab work:  None today; declines STI testing  Eligible vaccinations:  Due for annual flu and COVID vaccines, as well as a Menveo booster today. Accepts flu vaccine but declines COVID and Menveo. Will think about getting Menveo at next appointment.  Cabenuva : Administered cabotegravir  400mg /26mL in left upper outer quadrant of the gluteal muscle. Administered rilpivirine  600 mg/2mL in the right upper outer quadrant of the gluteal muscle. No issues with injections. He will follow up in 1 month for next set of injections.   Plan: - Cabenuva  injections administered - Next injections scheduled for 02/13/24 and 03/11/24 with me; and 04/15/24 with Dr. Dennise - Administered 2025-2026 influenza vaccine - Call with  any issues or questions  Tamalyn Wadsworth L. Nicholis Stepanek, PharmD, BCIDP, AAHIVP, CPP Clinical Pharmacist Practitioner - Infectious Diseases Clinical Pharmacist Lead - Specialty Pharmacy [facility]

## 2024-01-08 ENCOUNTER — Other Ambulatory Visit: Payer: Self-pay

## 2024-01-08 ENCOUNTER — Ambulatory Visit (INDEPENDENT_AMBULATORY_CARE_PROVIDER_SITE_OTHER): Payer: Self-pay | Admitting: Pharmacist

## 2024-01-08 DIAGNOSIS — Z23 Encounter for immunization: Secondary | ICD-10-CM

## 2024-01-08 DIAGNOSIS — B20 Human immunodeficiency virus [HIV] disease: Secondary | ICD-10-CM | POA: Diagnosis not present

## 2024-01-08 MED ORDER — CABOTEGRAVIR & RILPIVIRINE ER 400 & 600 MG/2ML IM SUER
1.0000 | Freq: Once | INTRAMUSCULAR | Status: AC
  Administered 2024-01-08: 1 via INTRAMUSCULAR

## 2024-01-31 ENCOUNTER — Other Ambulatory Visit: Payer: Self-pay

## 2024-01-31 NOTE — Progress Notes (Signed)
 Specialty Pharmacy Refill Coordination Note  Jose Olsen is a 38 y.o. male assessed today regarding refills of clinic administered specialty medication(s) Cabotegravir  & Rilpivirine  (CABENUVA )   Clinic requested Courier to Provider Office   Delivery date: 02/08/24   Verified address: [address] [place] [address]   Medication will be filled on 02/07/24.

## 2024-02-07 ENCOUNTER — Other Ambulatory Visit: Payer: Self-pay

## 2024-02-08 ENCOUNTER — Telehealth: Payer: Self-pay

## 2024-02-08 NOTE — Telephone Encounter (Signed)
 [name] Patient Advocate Encounter  [name] medications CABENUVA  have been couriered to [name] from [facility] and will be administered at the patients appointment on 02/13/24.  Charmaine Sharps, CPhT Specialty Pharmacy Patient [facility] Phone: [phone] Fax:  [phone]

## 2024-02-12 NOTE — Progress Notes (Unsigned)
 HPI: Jose Olsen is a 38 y.o. male who presents to the [name] pharmacy clinic for Cabenuva  administration.  Referring ID Provider: Dr. Dennise  Patient Active Problem List   Diagnosis Date Noted   Injection education, encounter for 10/13/2020   Healthcare maintenance 09/15/2020   Screening for STDs (sexually transmitted diseases) 06/16/2020   Medication monitoring encounter 11/04/2019   HTN (hypertension) 05/07/2019   Screening examination for venereal disease 03/28/2016   Encounter for long-term (current) use of high-risk medication 03/28/2016   Human immunodeficiency virus (HIV) disease (HCC) 12/02/2010   ONYCHOMYCOSIS 05/19/2009    [name] Medications  New Prescriptions   No medications on file  Previous Medications   CABOTEGRAVIR  & RILPIVIRINE  ER (CABENUVA ) 400 & 600 MG/2ML INJECTION    Inject 1 kit into the muscle every 30 (thirty) days.   CYCLOBENZAPRINE  (FLEXERIL ) 10 MG TABLET    Take 1 tablet (10 mg total) by mouth 2 (two) times daily as needed for muscle spasms.   NAPROXEN  (NAPROSYN ) 500 MG TABLET    Take 1 tablet (500 mg total) by mouth 2 (two) times daily as needed.   TERBINAFINE  (LAMISIL ) 250 MG TABLET    Take 1 tablet (250 mg total) by mouth daily.  Modified Medications   No medications on file  Discontinued Medications   No medications on file    Allergies: No Known Allergies  Labs: Lab Results  Component Value Date   HIV1RNAQUANT NOT DETECTED 09/12/2023   HIV1RNAQUANT Not Detected 04/18/2023   HIV1RNAQUANT Not Detected 10/19/2022   CD4TABS 794 09/12/2023   CD4TABS 815 10/19/2022   CD4TABS 753 08/12/2021    RPR and STI Lab Results  Component Value Date   LABRPR NON-REACTIVE 09/12/2023   LABRPR NON-REACTIVE 10/19/2022   LABRPR NON-REACTIVE 08/12/2021   LABRPR NON-REACTIVE 12/16/2020   LABRPR NON-REACTIVE 06/16/2020    STI Results GC GC CT CT  Latest Ref Rng & Units  NEGATIVE  NEGATIVE  09/12/2023 10:08 AM Negative   Negative    10/19/2022   2:01 PM Negative    Negative    Negative   Negative    Negative    Negative    08/12/2021 10:14 AM Negative    Negative    Negative   Negative    Negative    Negative    02/15/2021  9:25 AM Negative   Negative    06/16/2020 10:36 AM Negative    Negative    Negative   Negative    Negative    Negative    05/07/2019  9:59 AM Negative    Negative    Negative   Negative    Negative    Negative    08/14/2018 12:00 AM Negative   Negative    10/10/2013  2:34 AM  POSITIVE   NEGATIVE     Hepatitis B Lab Results  Component Value Date   HEPBSAB REACTIVE (A) 12/16/2020   HEPBSAG NEGATIVE 11/11/2010   Hepatitis C Lab Results  Component Value Date   HEPCAB NON-REACTIVE 09/12/2023   Hepatitis A Lab Results  Component Value Date   HAV REACTIVE (A) 12/16/2020   Lipids: Lab Results  Component Value Date   CHOL 206 (H) 09/12/2023   TRIG 149 09/12/2023   HDL 45 09/12/2023   CHOLHDL 4.6 09/12/2023   VLDL 19 12/14/2012   LDLCALC 133 (H) 09/12/2023    Target Date: 26th  Assessment: Vickey presents today for his maintenance Cabenuva  injections. Past injections were tolerated well without issues. Last  HIV RNA was undetectable on 09/12/2023. Doing well with no issues today.  Lab work:  ***Oral/urine/rectal cytologies for [employer], RPR ***   Eligible vaccinations: Covid, Shingrix 1/2  ***  Cabenuva : Administered cabotegravir  400 mg/2mL in left upper outer quadrant of the gluteal muscle. Administered rilpivirine  600 mg/2mL in the right upper outer quadrant of the gluteal muscle. No issues with injections. Jessie will follow up in 1 month for next set of injections.  Plan: - Cabenuva  injections administered - Oral/urine/rectal cytologies for GC/Chlamydia, RPR ***  - Consider checking HIV RNA at next visit  - Next injections scheduled for 03/11/2024 with [name], then 04/15/2024 with Dr. Dennise  - Call with any issues or questions  Feliciano Close, PharmD PGY2 Infectious  Diseases Pharmacy Resident

## 2024-02-13 ENCOUNTER — Ambulatory Visit: Payer: Self-pay | Admitting: Pharmacist

## 2024-02-13 DIAGNOSIS — Z113 Encounter for screening for infections with a predominantly sexual mode of transmission: Secondary | ICD-10-CM

## 2024-02-21 ENCOUNTER — Other Ambulatory Visit: Payer: Self-pay

## 2024-02-21 ENCOUNTER — Ambulatory Visit: Admitting: Pharmacist

## 2024-02-21 ENCOUNTER — Other Ambulatory Visit (HOSPITAL_COMMUNITY): Payer: Self-pay

## 2024-02-21 DIAGNOSIS — B20 Human immunodeficiency virus [HIV] disease: Secondary | ICD-10-CM

## 2024-02-21 MED ORDER — CABOTEGRAVIR & RILPIVIRINE ER 400 & 600 MG/2ML IM SUER
1.0000 | Freq: Once | INTRAMUSCULAR | Status: AC
  Administered 2024-02-21: 1 via INTRAMUSCULAR

## 2024-02-21 NOTE — Progress Notes (Signed)
 HPI: Jose Olsen is a 38 y.o. male who presents to the [name] pharmacy clinic for Cabenuva  administration.  Referring ID Provider: Dr. Dennise  Patient Active Problem List   Diagnosis Date Noted   Injection education, encounter for 10/13/2020   Healthcare maintenance 09/15/2020   Screening for STDs (sexually transmitted diseases) 06/16/2020   Medication monitoring encounter 11/04/2019   HTN (hypertension) 05/07/2019   Screening examination for venereal disease 03/28/2016   Encounter for long-term (current) use of high-risk medication 03/28/2016   Human immunodeficiency virus (HIV) disease (HCC) 12/02/2010   ONYCHOMYCOSIS 05/19/2009    [name] Medications  New Prescriptions   No medications on file  Previous Medications   CABOTEGRAVIR  & RILPIVIRINE  ER (CABENUVA ) 400 & 600 MG/2ML INJECTION    Inject 1 kit into the muscle every 30 (thirty) days.   CYCLOBENZAPRINE  (FLEXERIL ) 10 MG TABLET    Take 1 tablet (10 mg total) by mouth 2 (two) times daily as needed for muscle spasms.   NAPROXEN  (NAPROSYN ) 500 MG TABLET    Take 1 tablet (500 mg total) by mouth 2 (two) times daily as needed.   TERBINAFINE  (LAMISIL ) 250 MG TABLET    Take 1 tablet (250 mg total) by mouth daily.  Modified Medications   No medications on file  Discontinued Medications   No medications on file    Allergies: No Known Allergies  Labs: Lab Results  Component Value Date   HIV1RNAQUANT NOT DETECTED 09/12/2023   HIV1RNAQUANT Not Detected 04/18/2023   HIV1RNAQUANT Not Detected 10/19/2022   CD4TABS 794 09/12/2023   CD4TABS 815 10/19/2022   CD4TABS 753 08/12/2021    RPR and STI Lab Results  Component Value Date   LABRPR NON-REACTIVE 09/12/2023   LABRPR NON-REACTIVE 10/19/2022   LABRPR NON-REACTIVE 08/12/2021   LABRPR NON-REACTIVE 12/16/2020   LABRPR NON-REACTIVE 06/16/2020    STI Results GC GC CT CT  Latest Ref Rng & Units  NEGATIVE  NEGATIVE  09/12/2023 10:08 AM Negative   Negative    10/19/2022   2:01 PM Negative    Negative    Negative   Negative    Negative    Negative    08/12/2021 10:14 AM Negative    Negative    Negative   Negative    Negative    Negative    02/15/2021  9:25 AM Negative   Negative    06/16/2020 10:36 AM Negative    Negative    Negative   Negative    Negative    Negative    05/07/2019  9:59 AM Negative    Negative    Negative   Negative    Negative    Negative    08/14/2018 12:00 AM Negative   Negative    10/10/2013  2:34 AM  POSITIVE   NEGATIVE     Hepatitis B Lab Results  Component Value Date   HEPBSAB REACTIVE (A) 12/16/2020   HEPBSAG NEGATIVE 11/11/2010   Hepatitis C Lab Results  Component Value Date   HEPCAB NON-REACTIVE 09/12/2023   Hepatitis A Lab Results  Component Value Date   HAV REACTIVE (A) 12/16/2020   Lipids: Lab Results  Component Value Date   CHOL 206 (H) 09/12/2023   TRIG 149 09/12/2023   HDL 45 09/12/2023   CHOLHDL 4.6 09/12/2023   VLDL 19 12/14/2012   LDLCALC 133 (H) 09/12/2023    Target Date: The 26th  Assessment: Jose Olsen presents today for his maintenance Cabenuva  injections. Past injections were tolerated well without issues.  Last HIV RNA was not detected in June. Doing well with no issues today.  Lab work:  Due for updated HIV RNA today  Eligible vaccinations:  Currently up to date on all recommended vaccines.   Cabenuva : Administered cabotegravir  400mg /40mL in left upper outer quadrant of the gluteal muscle. Administered rilpivirine  600 mg/2mL in the right upper outer quadrant of the gluteal muscle. No issues with injections. He will follow up in 1 month for next set of injections.   Plan: - Cabenuva  injections administered - Next injections scheduled for 03/18/24 with me and 04/15/24 with Dr. Dennise - Call with any issues or questions  Jose Olsen, PharmD, BCIDP, AAHIVP, CPP Clinical Pharmacist Practitioner - Infectious Diseases Clinical Pharmacist Lead - Specialty Pharmacy  [facility]

## 2024-02-21 NOTE — Progress Notes (Signed)
 [name] refill has been scheduled in [place].

## 2024-02-21 NOTE — Progress Notes (Signed)
 Specialty Pharmacy Refill Coordination Note  Jose Olsen is a 38 y.o. male assessed today regarding refills of clinic administered specialty medication(s) Cabotegravir  & Rilpivirine  (CABENUVA )   Clinic requested Courier to Provider Office   Delivery date: 03/07/24   Verified address: [address] [place] [address]   Medication will be filled on 03/06/24.

## 2024-02-23 LAB — HIV-1 RNA QUANT-NO REFLEX-BLD
HIV 1 RNA Quant: NOT DETECTED {copies}/mL
HIV-1 RNA Quant, Log: NOT DETECTED {Log_copies}/mL

## 2024-03-06 ENCOUNTER — Other Ambulatory Visit: Payer: Self-pay

## 2024-03-07 ENCOUNTER — Telehealth: Payer: Self-pay

## 2024-03-07 NOTE — Telephone Encounter (Signed)
 [name] Patient Advocate Encounter  [name] medications CABENUVA  have been couriered to [name] from [facility] and will be administered at the patients appointment on 03/18/24.  Charmaine Sharps, CPhT Specialty Pharmacy Patient [facility] Phone: [phone] Fax:  [phone]

## 2024-03-11 ENCOUNTER — Ambulatory Visit: Payer: Self-pay | Admitting: Pharmacist

## 2024-03-18 ENCOUNTER — Ambulatory Visit: Admitting: Family

## 2024-03-18 ENCOUNTER — Other Ambulatory Visit: Payer: Self-pay

## 2024-03-18 ENCOUNTER — Encounter: Payer: Self-pay | Admitting: Family

## 2024-03-18 ENCOUNTER — Ambulatory Visit: Admitting: Pharmacist

## 2024-03-18 VITALS — BP 131/84 | HR 76 | Temp 98.3°F | Ht 70.0 in | Wt 200.0 lb

## 2024-03-18 DIAGNOSIS — B20 Human immunodeficiency virus [HIV] disease: Secondary | ICD-10-CM | POA: Diagnosis not present

## 2024-03-18 DIAGNOSIS — Z0000 Encounter for general adult medical examination without abnormal findings: Secondary | ICD-10-CM | POA: Diagnosis not present

## 2024-03-18 MED ORDER — CABOTEGRAVIR & RILPIVIRINE ER 400 & 600 MG/2ML IM SUER
1.0000 | Freq: Once | INTRAMUSCULAR | Status: AC
  Administered 2024-03-18: 1 via INTRAMUSCULAR

## 2024-03-18 NOTE — Progress Notes (Signed)
 "   Brief Narrative   Patient ID: Jose Olsen, male    DOB: 09/01/1985, 38 y.o.   MRN: [id number]  Mr. Hoskin is a 38 year old African-American male diagnosed with HIV disease in August 2012 with risk factor of MSM.  Initial viral load was 32,100 with CD4 count 650.  Entered care at [facility] stage I.  Genotype with no significant medication resistant mutations.  No history of opportunistic infection.  ART experienced with Atripla, Genvoya , and now Cabenuva .   Subjective:   Chief Complaint  Patient presents with   Follow-up    B20    HPI:  Mehran Guderian is a 38 y.o. male with HIV disease last seen on 02/21/24 by Charlott Flowers, PharmD, CPP for  Q 1 month injection of Cabenuva . Virus was undetectable and well controlled. Target date is the 26th. Here today for follow up and next injection.   Mr. Butcher has been doing well since last office visit and continues to receive Cabenuva  with no adverse side effects.  Working full-time with stable housing, transportation, and access to food.  No new concerns/complaints.  Possibly getting insurance.  Sexually active with condoms and site-specific STD testing offered.  Due for routine dental care.  Healthcare maintenance reviewed.  Denies fevers, chills, night sweats, headaches, changes in vision, neck pain/stiffness, nausea, diarrhea, vomiting, lesions or rashes.  Lab Results  Component Value Date   CD4TCELL 44 09/12/2023   CD4TABS 794 09/12/2023   Lab Results  Component Value Date   HIV1RNAQUANT NOT DETECTED 02/21/2024     Allergies[1]    Outpatient Medications Prior to Visit  Medication Sig Dispense Refill   cabotegravir  & rilpivirine  ER (CABENUVA ) 400 & 600 MG/2ML injection Inject 1 kit into the muscle every 30 (thirty) days. 4 mL 11   cyclobenzaprine  (FLEXERIL ) 10 MG tablet Take 1 tablet (10 mg total) by mouth 2 (two) times daily as needed for muscle spasms. (Patient not taking: Reported on 03/18/2024) 10 tablet 0   naproxen   (NAPROSYN ) 500 MG tablet Take 1 tablet (500 mg total) by mouth 2 (two) times daily as needed. (Patient not taking: Reported on 03/18/2024) 14 tablet 0   terbinafine  (LAMISIL ) 250 MG tablet Take 1 tablet (250 mg total) by mouth daily. (Patient not taking: Reported on 09/12/2023) 28 tablet 1   No facility-administered medications prior to visit.     Past Medical History:  Diagnosis Date   Chicken pox    HIV infection (HCC)      History reviewed. No pertinent surgical history.      Review of Systems  Constitutional:  Negative for appetite change, chills, fatigue, fever and unexpected weight change.  Eyes:  Negative for visual disturbance.  Respiratory:  Negative for cough, chest tightness, shortness of breath and wheezing.   Cardiovascular:  Negative for chest pain and leg swelling.  Gastrointestinal:  Negative for abdominal pain, constipation, diarrhea, nausea and vomiting.  Genitourinary:  Negative for dysuria, flank pain, frequency, genital sores, hematuria and urgency.  Skin:  Negative for rash.  Allergic/Immunologic: Negative for immunocompromised state.  Neurological:  Negative for dizziness and headaches.     Objective:   BP 131/84   Pulse 76   Temp 98.3 F (36.8 C) (Oral)   Ht 5' 10 (1.778 m)   Wt 200 lb (90.7 kg)   SpO2 100%   BMI 28.70 kg/m  Nursing note and vital signs reviewed.  Physical Exam Constitutional:      General: He is not in acute distress.  Appearance: He is well-developed.  Eyes:     Conjunctiva/sclera: Conjunctivae normal.  Cardiovascular:     Rate and Rhythm: Normal rate and regular rhythm.     Heart sounds: Normal heart sounds. No murmur heard.    No friction rub. No gallop.  Pulmonary:     Effort: Pulmonary effort is normal. No respiratory distress.     Breath sounds: Normal breath sounds. No wheezing or rales.  Chest:     Chest wall: No tenderness.  Abdominal:     General: Bowel sounds are normal.     Palpations: Abdomen is  soft.     Tenderness: There is no abdominal tenderness.  Musculoskeletal:     Cervical back: Neck supple.  Lymphadenopathy:     Cervical: No cervical adenopathy.  Skin:    General: Skin is warm and dry.     Findings: No rash.  Neurological:     Mental Status: He is alert and oriented to person, place, and time.          09/12/2023    9:35 AM 05/16/2023   10:22 AM 10/19/2022    1:54 PM 04/19/2022   10:05 AM 08/12/2021   10:06 AM  Depression screen PHQ 2/9  Decreased Interest 0 0 0 0 0  Down, Depressed, Hopeless 0 0 0 1 0  PHQ - 2 Score 0 0 0 1 0  Altered sleeping 0      Tired, decreased energy 0      Change in appetite 0      Feeling bad or failure about yourself  0      Trouble concentrating 0      Moving slowly or fidgety/restless 0      Suicidal thoughts 0      PHQ-9 Score 0          Data saved with a previous flowsheet row definition        09/12/2023    9:35 AM  GAD 7 : Generalized Anxiety Score  Nervous, Anxious, on Edge 0  Control/stop worrying 0  Worry too much - different things 0  Trouble relaxing 0  Restless 0  Easily annoyed or irritable 0  Afraid - awful might happen 0  Total GAD 7 Score 0       Assessment & Plan:    Patient Active Problem List   Diagnosis Date Noted   Injection education, encounter for 10/13/2020   Healthcare maintenance 09/15/2020   Screening for STDs (sexually transmitted diseases) 06/16/2020   Medication monitoring encounter 11/04/2019   HTN (hypertension) 05/07/2019   Screening examination for venereal disease 03/28/2016   Encounter for long-term (current) use of high-risk medication 03/28/2016   Human immunodeficiency virus (HIV) disease (HCC) 12/02/2010   ONYCHOMYCOSIS 05/19/2009     Problem List Items Addressed This Visit       Other   Human immunodeficiency virus (HIV) disease (HCC) - Primary   Mr. Grajales continues to have well-controlled virus with good adherence and tolerance to Cabenuva  q 1 month with target  date of the 26th.  Reviewed previous lab work and discussed plan of care and U equals U.  Covered by [employer] and may be getting new insurance and advised to update clinic with change of insurance if this occurs.  Q 1 month injection of Cabenuva  provided without complication.  Plan for follow-up in 1 month or sooner if needed.      Healthcare maintenance   Discussed importance of safe sexual practice and  condom use. Condoms and site specific STD testing offered.  Vaccinations reviewed and following counseling declined. Anal cancer screening up-to-date. Due for routine dental care which he will schedule independently        I have discontinued Dezi Adan's terbinafine , cyclobenzaprine , and naproxen . I am also having him maintain his cabotegravir  & rilpivirine  ER.   Follow-up: Return in about 1 month (around 04/18/2024). or sooner if needed.    Cathlyn July, MSN, FNP-C Nurse Practitioner [facility] for [facility]ious Disease [name] Main number: [phone]      [1] No Known Allergies  "

## 2024-03-18 NOTE — Addendum Note (Signed)
 Addended by: Blondie Riggsbee M on: 03/18/2024 02:45 PM   Modules accepted: Orders

## 2024-03-18 NOTE — Assessment & Plan Note (Signed)
 Mr. Miske continues to have well-controlled virus with good adherence and tolerance to Cabenuva  q 1 month with target date of the 26th.  Reviewed previous lab work and discussed plan of care and U equals U.  Covered by [employer] and may be getting new insurance and advised to update clinic with change of insurance if this occurs.  Q 1 month injection of Cabenuva  provided without complication.  Plan for follow-up in 1 month or sooner if needed.

## 2024-03-18 NOTE — Assessment & Plan Note (Signed)
 Discussed importance of safe sexual practice and condom use. Condoms and site specific STD testing offered.  Vaccinations reviewed and following counseling declined. Anal cancer screening up-to-date. Due for routine dental care which he will schedule independently

## 2024-03-18 NOTE — Patient Instructions (Signed)
 Nice to see you.  Plan for follow up in 1 months or sooner if needed with lab work on the same day.  Have a great day and stay safe!

## 2024-04-01 ENCOUNTER — Other Ambulatory Visit: Payer: Self-pay

## 2024-04-01 ENCOUNTER — Other Ambulatory Visit (HOSPITAL_COMMUNITY): Payer: Self-pay

## 2024-04-01 ENCOUNTER — Other Ambulatory Visit: Payer: Self-pay | Admitting: Pharmacist

## 2024-04-01 DIAGNOSIS — B20 Human immunodeficiency virus [HIV] disease: Secondary | ICD-10-CM

## 2024-04-01 MED ORDER — CABOTEGRAVIR & RILPIVIRINE ER 400 & 600 MG/2ML IM SUER
1.0000 | INTRAMUSCULAR | 11 refills | Status: AC
  Filled 2024-04-01: qty 4, 30d supply, fill #0

## 2024-04-01 NOTE — Progress Notes (Signed)
 Specialty Pharmacy Refill Coordination Note  Jose Olsen is a 39 y.o. male assessed today regarding refills of clinic administered specialty medication(s) Cabotegravir  & Rilpivirine  (CABENUVA )   Clinic requested Courier to Provider Office   Delivery date: 04/09/24   Verified address: [address] [place] [address]   Medication will be filled on 04/08/24.

## 2024-04-09 ENCOUNTER — Telehealth: Payer: Self-pay

## 2024-04-09 NOTE — Telephone Encounter (Signed)
 [name] Patient Advocate Encounter  [name] medications Cabenuva  have been couriered to [name] from [facility] and will be administered at the patients appointment on 04/15/24.  Arland Hutchinson, CPhT Specialty Pharmacy Patient [facility] for Infectious Disease Phone: [phone] Fax:  [phone]

## 2024-04-11 ENCOUNTER — Ambulatory Visit: Admitting: Internal Medicine

## 2024-04-15 ENCOUNTER — Ambulatory Visit: Admitting: Internal Medicine

## 2024-04-18 ENCOUNTER — Ambulatory Visit: Payer: Self-pay | Admitting: Internal Medicine

## 2024-04-18 ENCOUNTER — Other Ambulatory Visit: Payer: Self-pay

## 2024-04-18 ENCOUNTER — Encounter: Payer: Self-pay | Admitting: Internal Medicine

## 2024-04-18 VITALS — BP 125/85 | HR 88 | Temp 98.6°F | Ht 70.0 in | Wt 206.0 lb

## 2024-04-18 DIAGNOSIS — B20 Human immunodeficiency virus [HIV] disease: Secondary | ICD-10-CM

## 2024-04-18 MED ORDER — CABOTEGRAVIR & RILPIVIRINE ER 400 & 600 MG/2ML IM SUER
1.0000 | Freq: Once | INTRAMUSCULAR | Status: AC
  Administered 2024-04-18: 1 via INTRAMUSCULAR

## 2024-04-18 NOTE — Progress Notes (Signed)
 "      [facility]     Jose Olsen is a 39 y.o. male presents for HIV management on cab,   Date of diagnosis About age 19-22 ART exposure atripla->genvoya -. Cab Past Ois no Risk factors: MSM Partners in last 2months 1 in the last 12 months 1. Same partner x 5 years Anal sex verse. Contraception sometimes     Social: Occupation: musician, gm Housing: house, by himself Support: partner, family Understanding of HIV: Etoh/drug/tobacco use: Socail/mj/no    Past Medical History:  Diagnosis Date   Chicken pox    HIV infection (HCC)     No past surgical history on file.  Family History  Problem Relation Age of Onset   Diabetes Paternal Uncle    Diabetes Maternal Grandmother    Hypertension Maternal Grandmother    Stroke Maternal Grandmother    Cancer Other        prostate   Medications Ordered Prior to Encounter[1]  Allergies[2]    Lab Results HIV 1 RNA Quant  Date Value  02/21/2024 NOT DETECTED copies/mL  09/12/2023 NOT DETECTED copies/mL  04/18/2023 Not Detected Copies/mL   CD4 T Cell Abs (/uL)  Date Value  09/12/2023 794  10/19/2022 815  08/12/2021 753   No results found for: HIV1GENOSEQ Lab Results  Component Value Date   WBC 4.5 09/12/2023   HGB 14.0 09/12/2023   HCT 43.3 09/12/2023   MCV 99.8 09/12/2023   PLT 271 09/12/2023    Lab Results  Component Value Date   CREATININE 0.99 09/12/2023   BUN 13 09/12/2023   NA 140 09/12/2023   K 4.2 09/12/2023   CL 106 09/12/2023   CO2 28 09/12/2023   Lab Results  Component Value Date   ALT 14 09/12/2023   AST 14 09/12/2023   ALKPHOS 55 03/28/2016   BILITOT 0.6 09/12/2023    Lab Results  Component Value Date   CHOL 206 (H) 09/12/2023   TRIG 149 09/12/2023   HDL 45 09/12/2023   LDLCALC 133 (H) 09/12/2023   Lab Results  Component Value Date   HAV REACTIVE (A) 12/16/2020   Lab Results  Component Value Date   HEPBSAG NEGATIVE 11/11/2010   HEPBSAB  REACTIVE (A) 12/16/2020   Lab Results  Component Value Date   HCVAB NEGATIVE 11/11/2010   Lab Results  Component Value Date   CHLAMYDIAWP Negative 09/12/2023   N Negative 09/12/2023   Lab Results  Component Value Date   GCPROBEAPT POSITIVE (A) 10/10/2013   No results found for: QUANTGOLD  Assessment/Plan #HIV -CD4 794 on 09/12/23, VL nd, on cab on 02/21/24 -cab today -HIV labs today -f/u in one year, f/u witih pharmacy for inj in the interim   #Vaccination COVID did not get Flu 01/08/24 Monkeypox PCV 20 on 06/07/21 Meningitis utd  HepA immunized, immune HEpB immunized, immune Tdap 07/12/21 Shingles   #Health maintenance -Quantiferon negative 09/12/23 -RPR nr 09/12/23 -HCV negative 09/12/23 -GC urine 09/12/23 -Lipid The ASCVD Risk score (Arnett DK, et al., 2019) failed to calculate for the following reasons:   The 2019 ASCVD risk score is only valid for ages 4 to 69 -lifestyple mo ldl 133 on 09/12/23, discuss statin at next visit -Dysplasia screen M today -Colonoscopy:-> age dependent       [name], MD [facility] I personally spent a total of 45 minutes in the care of the patient today including preparing to see the patient,  getting/reviewing separately obtained history, performing a medically appropriate exam/evaluation, counseling and educating, placing orders, documenting clinical information in the EHR, independently interpreting results, and communicating results.     [1]  Current Outpatient Medications on File Prior to Visit  Medication Sig Dispense Refill   cabotegravir  & rilpivirine  ER (CABENUVA ) 400 & 600 MG/2ML injection Inject 1 kit into the muscle every 30 (thirty) days. 4 mL 11   No current facility-administered medications on file prior to visit.  [2] No Known Allergies  "

## 2024-04-19 LAB — T-HELPER CELLS (CD4) COUNT (NOT AT ARMC)
CD4 % Helper T Cell: 40 % (ref 33–65)
CD4 T Cell Abs: 702 /uL (ref 400–1790)

## 2024-04-20 LAB — CBC WITH DIFFERENTIAL/PLATELET
Absolute Lymphocytes: 1943 {cells}/uL (ref 850–3900)
Absolute Monocytes: 361 {cells}/uL (ref 200–950)
Basophils Absolute: 29 {cells}/uL (ref 0–200)
Basophils Relative: 0.7 %
Eosinophils Absolute: 90 {cells}/uL (ref 15–500)
Eosinophils Relative: 2.2 %
HCT: 42.9 % (ref 39.4–51.1)
Hemoglobin: 14.3 g/dL (ref 13.2–17.1)
MCH: 32 pg (ref 27.0–33.0)
MCHC: 33.3 g/dL (ref 31.6–35.4)
MCV: 96 fL (ref 81.4–101.7)
MPV: 10 fL (ref 7.5–12.5)
Monocytes Relative: 8.8 %
Neutro Abs: 1677 {cells}/uL (ref 1500–7800)
Neutrophils Relative %: 40.9 %
Platelets: 282 10*3/uL (ref 140–400)
RBC: 4.47 Million/uL (ref 4.20–5.80)
RDW: 11.8 % (ref 11.0–15.0)
Total Lymphocyte: 47.4 %
WBC: 4.1 10*3/uL (ref 3.8–10.8)

## 2024-04-20 LAB — COMPLETE METABOLIC PANEL WITHOUT GFR
AG Ratio: 2 (calc) (ref 1.0–2.5)
ALT: 35 U/L (ref 9–46)
AST: 22 U/L (ref 10–40)
Albumin: 4.8 g/dL (ref 3.6–5.1)
Alkaline phosphatase (APISO): 62 U/L (ref 36–130)
BUN: 10 mg/dL (ref 7–25)
CO2: 31 mmol/L (ref 20–32)
Calcium: 9.8 mg/dL (ref 8.6–10.3)
Chloride: 103 mmol/L (ref 98–110)
Creat: 1.05 mg/dL (ref 0.60–1.26)
Globulin: 2.4 g/dL (ref 1.9–3.7)
Glucose, Bld: 103 mg/dL — ABNORMAL HIGH (ref 65–99)
Potassium: 4.4 mmol/L (ref 3.5–5.3)
Sodium: 140 mmol/L (ref 135–146)
Total Bilirubin: 0.6 mg/dL (ref 0.2–1.2)
Total Protein: 7.2 g/dL (ref 6.1–8.1)

## 2024-04-20 LAB — HIV-1 RNA QUANT-NO REFLEX-BLD
HIV 1 RNA Quant: NOT DETECTED {copies}/mL
HIV-1 RNA Quant, Log: NOT DETECTED {Log_copies}/mL

## 2024-05-21 ENCOUNTER — Encounter: Payer: Self-pay | Admitting: Pharmacist
